# Patient Record
Sex: Female | Born: 1980 | Race: White | Hispanic: No | Marital: Married | Smoking: Never smoker
Health system: Southern US, Community
[De-identification: ages and names within clinical notes are randomized; demographics above are authoritative.]

## PROBLEM LIST (undated history)

## (undated) DIAGNOSIS — Z8719 Personal history of other diseases of the digestive system: Secondary | ICD-10-CM

## (undated) DIAGNOSIS — IMO0002 Reserved for concepts with insufficient information to code with codable children: Secondary | ICD-10-CM

## (undated) DIAGNOSIS — O9229 Other disorders of breast associated with pregnancy and the puerperium: Secondary | ICD-10-CM

## (undated) DIAGNOSIS — O139 Gestational [pregnancy-induced] hypertension without significant proteinuria, unspecified trimester: Secondary | ICD-10-CM

## (undated) DIAGNOSIS — N926 Irregular menstruation, unspecified: Secondary | ICD-10-CM

## (undated) DIAGNOSIS — Z22330 Carrier of Group B streptococcus: Secondary | ICD-10-CM

## (undated) DIAGNOSIS — T4145XA Adverse effect of unspecified anesthetic, initial encounter: Secondary | ICD-10-CM

## (undated) DIAGNOSIS — R300 Dysuria: Secondary | ICD-10-CM

## (undated) DIAGNOSIS — Z8619 Personal history of other infectious and parasitic diseases: Secondary | ICD-10-CM

## (undated) HISTORY — DX: Personal history of other diseases of the digestive system: Z87.19

## (undated) HISTORY — DX: Adverse effect of unspecified anesthetic, initial encounter: T41.45XA

## (undated) HISTORY — DX: Irregular menstruation, unspecified: N92.6

## (undated) HISTORY — PX: NO PAST SURGERIES: SHX2092

## (undated) HISTORY — DX: Reserved for concepts with insufficient information to code with codable children: IMO0002

## (undated) HISTORY — DX: Carrier of group B Streptococcus: Z22.330

## (undated) HISTORY — DX: Dysuria: R30.0

## (undated) HISTORY — DX: Personal history of other infectious and parasitic diseases: Z86.19

## (undated) HISTORY — DX: Gestational (pregnancy-induced) hypertension without significant proteinuria, unspecified trimester: O13.9

## (undated) HISTORY — DX: Other disorders of breast associated with pregnancy and the puerperium: O92.29

## (undated) HISTORY — PX: CYSTOSCOPY: SUR368

---

## 2002-03-29 DIAGNOSIS — IMO0002 Reserved for concepts with insufficient information to code with codable children: Secondary | ICD-10-CM

## 2002-03-29 HISTORY — DX: Reserved for concepts with insufficient information to code with codable children: IMO0002

## 2002-05-28 ENCOUNTER — Other Ambulatory Visit: Admission: RE | Admit: 2002-05-28 | Discharge: 2002-05-28 | Payer: Self-pay | Admitting: *Deleted

## 2003-01-03 ENCOUNTER — Inpatient Hospital Stay (HOSPITAL_COMMUNITY): Admission: AD | Admit: 2003-01-03 | Discharge: 2003-01-03 | Payer: Self-pay | Admitting: Obstetrics and Gynecology

## 2003-01-03 ENCOUNTER — Encounter: Payer: Self-pay | Admitting: Obstetrics and Gynecology

## 2003-03-30 DIAGNOSIS — Z8719 Personal history of other diseases of the digestive system: Secondary | ICD-10-CM

## 2003-03-30 HISTORY — DX: Personal history of other diseases of the digestive system: Z87.19

## 2003-06-14 ENCOUNTER — Inpatient Hospital Stay (HOSPITAL_COMMUNITY): Admission: AD | Admit: 2003-06-14 | Discharge: 2003-06-14 | Payer: Self-pay | Admitting: Obstetrics and Gynecology

## 2003-07-30 ENCOUNTER — Inpatient Hospital Stay (HOSPITAL_COMMUNITY): Admission: AD | Admit: 2003-07-30 | Discharge: 2003-08-01 | Payer: Self-pay | Admitting: Obstetrics and Gynecology

## 2003-09-04 ENCOUNTER — Other Ambulatory Visit: Admission: RE | Admit: 2003-09-04 | Discharge: 2003-09-04 | Payer: Self-pay | Admitting: Obstetrics and Gynecology

## 2005-02-12 ENCOUNTER — Inpatient Hospital Stay (HOSPITAL_COMMUNITY): Admission: AD | Admit: 2005-02-12 | Discharge: 2005-02-15 | Payer: Self-pay | Admitting: Obstetrics and Gynecology

## 2005-03-24 ENCOUNTER — Other Ambulatory Visit: Admission: RE | Admit: 2005-03-24 | Discharge: 2005-03-24 | Payer: Self-pay | Admitting: Obstetrics and Gynecology

## 2005-04-29 DIAGNOSIS — N926 Irregular menstruation, unspecified: Secondary | ICD-10-CM

## 2005-04-29 HISTORY — DX: Irregular menstruation, unspecified: N92.6

## 2006-03-29 DIAGNOSIS — T8859XA Other complications of anesthesia, initial encounter: Secondary | ICD-10-CM

## 2006-03-29 HISTORY — DX: Other complications of anesthesia, initial encounter: T88.59XA

## 2006-10-07 ENCOUNTER — Inpatient Hospital Stay (HOSPITAL_COMMUNITY): Admission: AD | Admit: 2006-10-07 | Discharge: 2006-10-07 | Payer: Self-pay | Admitting: Obstetrics and Gynecology

## 2006-11-01 ENCOUNTER — Inpatient Hospital Stay (HOSPITAL_COMMUNITY): Admission: AD | Admit: 2006-11-01 | Discharge: 2006-11-03 | Payer: Self-pay | Admitting: Obstetrics and Gynecology

## 2006-11-28 DIAGNOSIS — R300 Dysuria: Secondary | ICD-10-CM

## 2006-11-28 HISTORY — DX: Dysuria: R30.0

## 2008-01-20 DIAGNOSIS — O9229 Other disorders of breast associated with pregnancy and the puerperium: Secondary | ICD-10-CM

## 2008-01-20 HISTORY — DX: Other disorders of breast associated with pregnancy and the puerperium: O92.29

## 2009-05-21 ENCOUNTER — Inpatient Hospital Stay (HOSPITAL_COMMUNITY): Admission: RE | Admit: 2009-05-21 | Discharge: 2009-05-23 | Payer: Self-pay | Admitting: Obstetrics and Gynecology

## 2010-06-17 LAB — CBC
HCT: 33.1 % — ABNORMAL LOW (ref 36.0–46.0)
HCT: 34.7 % — ABNORMAL LOW (ref 36.0–46.0)
Hemoglobin: 11.3 g/dL — ABNORMAL LOW (ref 12.0–15.0)
Hemoglobin: 11.8 g/dL — ABNORMAL LOW (ref 12.0–15.0)
MCHC: 33.9 g/dL (ref 30.0–36.0)
MCHC: 34.1 g/dL (ref 30.0–36.0)
MCV: 92.1 fL (ref 78.0–100.0)
MCV: 92.7 fL (ref 78.0–100.0)
Platelets: 149 10*3/uL — ABNORMAL LOW (ref 150–400)
Platelets: 163 10*3/uL (ref 150–400)
RBC: 3.57 MIL/uL — ABNORMAL LOW (ref 3.87–5.11)
RBC: 3.77 MIL/uL — ABNORMAL LOW (ref 3.87–5.11)
RDW: 13.8 % (ref 11.5–15.5)
RDW: 13.9 % (ref 11.5–15.5)
WBC: 10.2 10*3/uL (ref 4.0–10.5)
WBC: 12.9 10*3/uL — ABNORMAL HIGH (ref 4.0–10.5)

## 2010-06-17 LAB — RPR: RPR Ser Ql: NONREACTIVE

## 2010-08-11 NOTE — H&P (Signed)
NAMEMADDELINE, Dickerson NO.:  [id number]   MEDICAL RECORD NO.:  [id number]          PATIENT TYPE:  MAT   LOCATION:  MATC                          [facility]:  [facility]   PHYSICIAN:  Hal Morales, M.D.DATE OF BIRTH:  13-Mar-1981   DATE OF ADMISSION:  11/01/2006  DATE OF DISCHARGE:                              HISTORY & PHYSICAL   HISTORY OF PRESENT ILLNESS:  Ms. Megan Dickerson is a 30 year old gravida 3, para 2-  0-0-2, at 40-3/7 weeks who presented complaining of increased uterine  contractions the last several hours.  Cervix was 2-3 cm in the office  this week.  Pregnancy is remarkable for:  1.  Positive group B Strep; 2.  History of mild pH with first pregnancy.   LABORATORY DATA:  Prenatal labs:  Blood type is O positive, Rh antibody  negative.  VDRL nonreactive.  Rubella titer positive.  Hepatitis B  surface antigen negative.  HIV was declined.  Cystic fibrosis testing  was negative.  GC and Chlamydia cultures were done in February.  The  patient declined first trimester screen.  She declined quadruple screen.  She had a normal Glucola.  She had group B Strep at 36 weeks which was  positive.  Pap smear was done but it is not resulted on our chart.  Hemoglobin upon into practice was 13.3.  It was within normal limits at  26 weeks.   HISTORY OF PRESENT PREGNANCY:  The patient entered care at approximately  six weeks.  She declined first trimester and quad screen.  She had an  ultrasound at eight weeks for dating.  She had another ultrasound at 18  weeks for normal growth and development.  She had a varicose vein in her  leg at 26 weeks and developed some vulvar varicosities at 32 weeks.  She  had positive group B Strep noted at 36 weeks.  She did have some bright  red spotting at 36 weeks and was seen at maternity admissions.  Cervix  was 1-2 cm and 80% vertex and -2 at 37 weeks.  She had an ultrasound on  July 11 for vaginal spotting. There was normal fluid and no  problems  with placenta.   OBSTETRICAL HISTORY:  In 2005 she had a vaginal birth of a female infant,  weight 7 pounds 11 ounces at 40-5/7 weeks.  She was in labor 12 hours.  She had IV anesthesia, analgesia, and local anesthesia.  She did have a  midline episiotomy, and positive group B Strep.  In 2006 she had a  vaginal birth of a female infant, weight 7 pounds 4 ounces at 39-1/7  weeks.  She was in labor 3-1/2 hours.  She had local anesthesia.  She  had premature rupture of membranes without the onset of labor.  In her  first pregnancy she had mild elevated blood pressure in the third  trimester but no preeclampsia.  She also had positive group B Strep with  both previous pregnancies.   PAST MEDICAL HISTORY:  1. She is a previous oral contraceptive user.  2. She reports the  usual childhood illnesses.  3. In childhood she was hospitalized once.  4. She has been hospitalized twice for childbirth.  5. As a child for UTI.   ALLERGIES:  She has no known medication allergies.   FAMILY HISTORY:  Her maternal grandfather passed away from heart  disease.  Maternal grandmother had chronic hypertension.  Maternal  grandmother had varicosities as well as her mother.  Maternal  grandmother had noninsulin dependent diabetes.   GENETIC HISTORY:  Remarkable for the patient's cousin with clubbed foot.   SOCIAL HISTORY:  The patient is married to the father of the baby.  He  is involved and supportive.  His name is Marily Konczal.  The patient is  college educated.  She is a Runner, broadcasting/film/video and a homemaker.  Her husband has 2-  1/2 years of college.  He is employed in Airline pilot.  She has been followed  by the certified nurse midwife service at [facility].  She  denies any alcohol, drug, or tobacco use during this pregnancy.   PHYSICAL EXAMINATION:  VITAL SIGNS:  Stable.  The patient is afebrile.  HEENT:  Within normal limits.  LUNGS:  Breath sounds are clear.  HEART:  Regular rate and rhythm without  murmur.  BREASTS:  Soft and nontender.  ABDOMEN:  Fundal height is approximately 38 cm, estimated fetal weight 7  to 7=1/2 pounds.  Uterine contractions are every 3-5 minutes, mild to  moderate quality.  CERVIX:  Posterior, 4-5 cm, 80%, vertex, and a -1 station.  Bulging bag  of water.  Fetal heart rate is reassuring with negative spontaneous CSG.  EXTREMITIES:  Reflexes are 2+ without clonus.  There is a trace edema  noted.   IMPRESSION:  1. 40-3/7 weeks.  2. Early labor.  3. Positive group B Strep.   PLAN:  1. Admit to birthing suite per consult with Dr. Pennie Rushing as attending      physician.  2. Routine certified nurse midwife orders.  3. Plan group B Strep prophylaxis with ampicillin secondary to      possible speed of advancing labor.  4. Will give antibiotic first dose, then consider artificial rupture      of membranes p.r.n. for labor progress.      Renaldo Reel Megan Dickerson, C.N.M.      Hal Morales, M.D.  Electronically Signed    VLL/MEDQ  D:  11/01/2006  T:  11/01/2006  Job:  [id number]

## 2010-08-14 NOTE — H&P (Signed)
NAMEKAMALI, SAKATA                           ACCOUNT NO.:  [id number]   MEDICAL RECORD NO.:  [id number]                   PATIENT TYPE:  INP   LOCATION:  9169                                 [facility]ACILITY:  [facility]   PHYSICIAN:  Crist Fat. Rivard, M.D.              DATE OF BIRTH:  01-07-1981   DATE OF ADMISSION:  07/30/2003  DATE OF DISCHARGE:                                HISTORY & PHYSICAL   Ms. Riccardi is a 30 year old gravida 1, para 0, at 40-5/7 weeks, who presented  with uterine contractions every three to five minutes times several hours.  She denied any leaking or bleeding and reported positive fetal movement.  Cervix had been 3 cm in the office.  Prenatal course had been remarkable  for:   1. Positive group B strep.  2. First trimester bleeding.  3. Mild elevation of blood pressure over the last one to two weeks with no     evidence of preeclampsia.    PRENATAL LABORATORY DATA:  Blood type is O positive, Rh antibody negative.  VDRL nonreactive.  Rubella titer positive.  Hepatitis B surface antigen  negative.  HIV was declined.  GC and Chlamydia cultures were declined.  Cystic fibrosis testing was declined.  Hemoglobin upon entering the practice  was 12.6 and was 11.5 at 27 weeks.  Group B strep culture was positive at 36  weeks.  GC and Chlamydia cultures were negative.  EDC of July 22, 2003, was  established by last menstrual period and was in agreement with ultrasound at  approximately 18 weeks.  One-hour Glucola was normal.   HISTORY OF PRESENT PREGNANCY:  The patient entered care at approximately 10  weeks.  She declined cultures.  She had some brown vaginal discharge at 11  weeks.  This was evaluated by ultrasound.  She had another ultrasound at 18  weeks and had limited anatomy.  She was treated for UTI at 20 weeks with a  yeast infection.  She has had a normal Glucola.  She was exposed to fifth  disease at 31 weeks.  She had negative titers.  She had some decreased fetal  movement and contractions at 34 weeks, which was treated with terbutaline  subcu, and also had negative cultures.  The rest of her pregnancy was  essentially uncomplicated.   PAST OBSTETRICAL HISTORY:  The patient is a primigravida.   PAST MEDICAL HISTORY:  She was on oral contraceptives until July 2004.  She  reports the usual childhood illnesses.  She has occasional yeast infections.  She had frequent cystitis as a child.   She has no known medication allergies.   FAMILY HISTORY:  Her maternal grandfather is deceased.  Her paternal  grandmother has hypertension.  Her mother had varicosities.  Her maternal  grandmother had noninsulin-dependent diabetes.  Father had migraines.  Paternal grandmother had possible breast cancer.  Paternal uncle, paternal  aunts, and  maternal grandfather were all smokers.   GENETIC HISTORY:  Remarkable for the patient's cousin having club feet.   SOCIAL HISTORY:  The patient is married to the father of the baby.  He is  involved and supportive.  His name is Kennie Karapetian.  The patient is Caucasian,  of the [address] faith.  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  Her husband has 2-1/2 years of college.  He is employed in Airline pilot.  She has been followed by the certified nurse midwife service of [facility].  She denies any alcohol, drug, or tobacco use during this  pregnancy.   PHYSICAL EXAMINATION:  VITAL SIGNS:  Blood pressure is 130/89 and 130/82,  other vital signs are stable.  HEENT:  Within normal limits.  CHEST:  Bilateral breath sounds are clear.  CARDIAC:  Regular rate and rhythm without murmur.  BREASTS:  Soft and nontender.  ABDOMEN:  Fundal height is approximately 39 cm.  Estimated fetal weight 7-  1/2 to 8 pounds.  Uterine contractions are every three minutes, 60 seconds  in duration, moderate quality.  PELVIC:  Cervical exam 4-5 cm, 90%, vertex at a -1 station with probable  transverse position of the vertex.   Fetal monitor is reactive with no  deceleration.  EXTREMITIES:  Deep tendon reflexes are 2+ without clonus.  There is a trace  edema noted.   IMPRESSION:  1. Intrauterine pregnancy at 40-5/7 weeks.  2. Early active labor.  3. Positive group B strep.   PLAN:  1. Admit to birthing suite per consult with Dr. Estanislado Pandy as attending     physician.  2. Routine certified nurse midwife orders.  3. Plan group B strep prophylaxis with penicillin G per standard dosing.  4. Patient declined pain medication at present.     Renaldo Reel Emilee Hero, C.N.M.                   Crist Fat Rivard, M.D.    Leeanne Mannan  D:  07/30/2003  T:  07/30/2003  Job:  [id number]

## 2010-08-14 NOTE — H&P (Signed)
[name], Megan Dickerson NO.:  [id number]   MEDICAL RECORD NO.:  [id number]          PATIENT TYPE:  INP   LOCATION:  9169                          [facility]   PHYSICIAN:  Hal Morales, M.D.DATE OF BIRTH:  Aug 03, 1980   DATE OF ADMISSION:  02/12/2005  DATE OF DISCHARGE:                                HISTORY & PHYSICAL   Megan Dickerson is a 30 year old married white female gravida 2, para 1-0-0-1 who  presents to the office at 39-1/[redacted] weeks gestation with questionable leaking  of fluid.  She began leaking clear fluid at 7 a.m.  She denies bleeding.  Reports mild contractions.  Reports positive fetal movements and denies  signs and symptoms of PIH.  Her pregnancy has been followed by the [facility] certified nurse midwife service and has been remarkable for  mild PIH with her first pregnancy, closely spaced pregnancies, group B Strep  positive.  Her prenatal laboratories were collected on July 14, 2004.  Hemoglobin 12.5, hematocrit 35.6, platelets 241,000.  Blood type O+.  Antibody negative.  RPR nonreactive.  Rubella immune.  Hepatitis B surface  antigen negative.  Pap smear within normal limits from June of 2005.  Gonorrhea negative.  Chlamydia negative.  Cystic fibrosis negative.  Her one-  hour Glucola was collected on November 16, 2004 and was 71.  RPR at that time  was nonreactive.  Hemoglobin at that time was 12.6.  Culture of the vaginal  tract for group B Strep, gonorrhea, and Chlamydia from January 05, 2005  gonorrhea and Chlamydia were negative, group B Strep was positive.   HISTORY OF PRESENT PREGNANCY:  Patient presented for [facility] to [facility] on July 23, 2004 at [redacted] weeks gestation.  She declined a quad  screen.  Pregnancy ultrasonography at 17-6/7 weeks shows growth consistent  with previous dating confirming [facility] of February 18, 2005.  Patient had an  episode of bright red bleeding at 33-5/[redacted] weeks gestation that appeared to be  due  to two blood blisters on her labia majora.  Rest of her prenatal care  was unremarkable.   OB HISTORY:  She is a gravida 2, para 1-0-0-1.  In May of 2005 she had a  vaginal delivery of a female infant weighing 7 pounds 11 ounces at 40-5/[redacted]  weeks gestation after 12 hours of labor.  She had IV pain medication.  Infant's name was Paediatric nurse.  She had a midline episiotomy with that delivery.  She had mild elevated blood pressure in the third trimester, but no  preeclampsia.  She also had group B Strep with that pregnancy.   MEDICAL HISTORY:  She has no medication allergies.  She experienced menarche  at the age of 21 with 28-day cycles lasting four days.  She reports having  had the usual childhood illnesses.  She has used oral contraceptives in the  past.  She used to have frequent cystitis as a child.   SURGICAL HISTORY:  Negative.   FAMILY HISTORY:  Remarkable for maternal grandfather with heart disease,  paternal grandmother with hypertension, maternal  grandmother with  varicosities as well as diabetes, paternal grandmother with breast cancer.   GENETIC HISTORY:  Remarkable for patient's cousin with club foot.   SOCIAL HISTORY:  Patient is married to the father of the baby.  His name is  Vincenza Hews.  He is involved and supportive.  They are of the [facility] faith.  Patient has 16 years education and is educated as a Runner, broadcasting/film/video, but unemployed  currently.  Father of the baby has 14-1/2 years education and is employed  full-time in Airline pilot.  They deny any alcohol, tobacco, or illicit drug use in  the pregnancy.   OBJECTIVE:  VITAL SIGNS:  Stable.  She is afebrile.  HEENT:  Grossly within normal limits.  CHEST:  Clear to auscultation.  HEART:  Regular rate and rhythm.  ABDOMEN:  Gravid in contour with fundal height extending approximately 38 cm  above the pubic symphysis.  Fetal heart rate is dopplered in the 140s.  Mild  uterine contractions per patient.  PELVIC:  Cervix is 3.5 cm, 80%, vertex, -2  with copious clear fluid present  that is positive for Nitrazine.  EXTREMITIES:  Within normal limits.   ASSESSMENT:  1.  Intrauterine pregnancy at term.  2.  Spontaneous rupture of membranes.  3.  Group B Strep positive.   PLAN:  1.  Admit to birthing suites and Dr. Pennie Rushing has been notified.  2.  Routine C.N.M. orders.  3.  Plan penicillin G for group B Strep prophylaxis.  4.  Anticipate spontaneous vaginal delivery.      Megan Dickerson, C.N.M.      Hal Morales, M.D.  Electronically Signed    KS/MEDQ  D:  02/12/2005  T:  02/12/2005  Job:  [id number]

## 2010-12-04 LAB — HIV ANTIBODY (ROUTINE TESTING W REFLEX): HIV: NONREACTIVE

## 2010-12-04 LAB — ANTIBODY SCREEN: Antibody Screen: NEGATIVE

## 2010-12-04 LAB — ABO/RH: RH Type: POSITIVE

## 2011-01-11 LAB — RAPID HIV SCREEN (WH-MAU): Rapid HIV Screen: NONREACTIVE

## 2011-01-11 LAB — CBC
HCT: 32.4 — ABNORMAL LOW
Hemoglobin: 11.9 — ABNORMAL LOW
Platelets: 193
Platelets: 236
RDW: 12.8
RDW: 12.9
WBC: 11.9 — ABNORMAL HIGH

## 2011-01-11 LAB — RPR: RPR Ser Ql: NONREACTIVE

## 2011-03-30 NOTE — L&D Delivery Note (Signed)
Delivery Note At 6:21 PM a viable female, "Megan Dickerson", was delivered via Vaginal, Spontaneous Delivery (Presentation: Left Occiput Anterior).  APGAR: 9, 10; weight 7 lb 9.3 oz (3440 g).   Placenta status: Intact, Spontaneous.  Cord: 3 vessels with the following complications: None.  Cord pH: NA  Anesthesia:   Episiotomy: None Lacerations: None Suture Repair: None Est. Blood Loss (mL): 175  Mom to postpartum.  Baby to skin to skin. Circumcision planned. Consent signed in L&D.  Nigel Bridgeman 06/29/2011, 6:42 PM

## 2011-06-07 ENCOUNTER — Encounter (INDEPENDENT_AMBULATORY_CARE_PROVIDER_SITE_OTHER): Payer: BC Managed Care – PPO | Admitting: Registered Nurse

## 2011-06-07 DIAGNOSIS — Z331 Pregnant state, incidental: Secondary | ICD-10-CM

## 2011-06-14 ENCOUNTER — Encounter (INDEPENDENT_AMBULATORY_CARE_PROVIDER_SITE_OTHER): Payer: BC Managed Care – PPO | Admitting: Obstetrics and Gynecology

## 2011-06-14 DIAGNOSIS — Z331 Pregnant state, incidental: Secondary | ICD-10-CM

## 2011-06-21 ENCOUNTER — Encounter (INDEPENDENT_AMBULATORY_CARE_PROVIDER_SITE_OTHER): Payer: BC Managed Care – PPO | Admitting: Obstetrics and Gynecology

## 2011-06-21 DIAGNOSIS — Z331 Pregnant state, incidental: Secondary | ICD-10-CM

## 2011-06-28 ENCOUNTER — Encounter (INDEPENDENT_AMBULATORY_CARE_PROVIDER_SITE_OTHER): Payer: BC Managed Care – PPO | Admitting: Obstetrics and Gynecology

## 2011-06-28 DIAGNOSIS — Z331 Pregnant state, incidental: Secondary | ICD-10-CM

## 2011-06-29 ENCOUNTER — Inpatient Hospital Stay (HOSPITAL_COMMUNITY)
Admission: AD | Admit: 2011-06-29 | Discharge: 2011-07-01 | DRG: 373 | Disposition: A | Discharge status: Home / Self Care | Payer: BC Managed Care – PPO | Source: Ambulatory Visit | Attending: Obstetrics and Gynecology | Admitting: Obstetrics and Gynecology

## 2011-06-29 ENCOUNTER — Encounter (HOSPITAL_COMMUNITY): Payer: Self-pay | Admitting: *Deleted

## 2011-06-29 DIAGNOSIS — O99892 Other specified diseases and conditions complicating childbirth: Principal | ICD-10-CM | POA: Diagnosis present

## 2011-06-29 DIAGNOSIS — Z3490 Encounter for supervision of normal pregnancy, unspecified, unspecified trimester: Secondary | ICD-10-CM

## 2011-06-29 DIAGNOSIS — O99820 Streptococcus B carrier state complicating pregnancy: Secondary | ICD-10-CM

## 2011-06-29 DIAGNOSIS — Z22330 Carrier of Group B streptococcus: Secondary | ICD-10-CM

## 2011-06-29 HISTORY — DX: Reserved for concepts with insufficient information to code with codable children: IMO0002

## 2011-06-29 LAB — CBC
HCT: 36.2 % (ref 36.0–46.0)
Hemoglobin: 12.3 g/dL (ref 12.0–15.0)
MCV: 90.7 fL (ref 78.0–100.0)
RBC: 3.99 MIL/uL (ref 3.87–5.11)
RDW: 13.4 % (ref 11.5–15.5)
WBC: 9.4 10*3/uL (ref 4.0–10.5)

## 2011-06-29 LAB — GC/CHLAMYDIA PROBE AMP, GENITAL

## 2011-06-29 LAB — RPR: RPR: NONREACTIVE

## 2011-06-29 MED ORDER — TETANUS-DIPHTH-ACELL PERTUSSIS 5-2.5-18.5 LF-MCG/0.5 IM SUSP: 0.5000 mL | Freq: Once | INTRAMUSCULAR | Status: DC

## 2011-06-29 MED ORDER — ZOLPIDEM TARTRATE 5 MG PO TABS: 5.0000 mg | ORAL_TABLET | Freq: Every evening | ORAL | Status: DC | PRN

## 2011-06-29 MED ORDER — PENICILLIN G POTASSIUM 5000000 UNITS IJ SOLR
2.5000 10*6.[IU] | INTRAVENOUS | Status: DC
  Administered 2011-06-29 (×2): 2.5 10*6.[IU] via INTRAVENOUS
  Filled 2011-06-29 (×5): qty 2.5

## 2011-06-29 MED ORDER — OXYTOCIN 20 UNITS IN LACTATED RINGERS INFUSION - SIMPLE
1.0000 m[IU]/min | INTRAVENOUS | Status: DC
  Administered 2011-06-29: 2 m[IU]/min via INTRAVENOUS
  Filled 2011-06-29: qty 1000

## 2011-06-29 MED ORDER — ONDANSETRON HCL 4 MG/2ML IJ SOLN: 4.0000 mg | INTRAMUSCULAR | Status: DC | PRN

## 2011-06-29 MED ORDER — LACTATED RINGERS IV SOLN
INTRAVENOUS | Status: DC
  Administered 2011-06-29 (×2): via INTRAVENOUS

## 2011-06-29 MED ORDER — PENICILLIN G POTASSIUM 5000000 UNITS IJ SOLR
5.0000 10*6.[IU] | Freq: Once | INTRAVENOUS | Status: AC
  Administered 2011-06-29: 5 10*6.[IU] via INTRAVENOUS
  Filled 2011-06-29: qty 5

## 2011-06-29 MED ORDER — WITCH HAZEL-GLYCERIN EX PADS: 1.0000 "application " | MEDICATED_PAD | CUTANEOUS | Status: DC | PRN

## 2011-06-29 MED ORDER — SENNOSIDES-DOCUSATE SODIUM 8.6-50 MG PO TABS
2.0000 | ORAL_TABLET | Freq: Every day | ORAL | Status: DC
  Administered 2011-06-29 – 2011-06-30 (×2): 2 via ORAL

## 2011-06-29 MED ORDER — IBUPROFEN 600 MG PO TABS
600.0000 mg | ORAL_TABLET | Freq: Four times a day (QID) | ORAL | Status: DC
  Administered 2011-06-29 – 2011-07-01 (×5): 600 mg via ORAL
  Filled 2011-06-29 (×7): qty 1

## 2011-06-29 MED ORDER — LACTATED RINGERS IV SOLN: 500.0000 mL | INTRAVENOUS | Status: DC | PRN

## 2011-06-29 MED ORDER — ONDANSETRON HCL 4 MG/2ML IJ SOLN
4.0000 mg | INTRAMUSCULAR | Status: AC
  Administered 2011-06-29: 4 mg via INTRAVENOUS

## 2011-06-29 MED ORDER — BENZOCAINE-MENTHOL 20-0.5 % EX AERO
INHALATION_SPRAY | CUTANEOUS | Status: AC
  Administered 2011-06-29: 23:00:00
  Filled 2011-06-29: qty 56

## 2011-06-29 MED ORDER — OXYTOCIN 20 UNITS IN LACTATED RINGERS INFUSION - SIMPLE: 125.0000 mL/h | Freq: Once | INTRAVENOUS | Status: DC

## 2011-06-29 MED ORDER — DIPHENHYDRAMINE HCL 25 MG PO CAPS: 25.0000 mg | ORAL_CAPSULE | Freq: Four times a day (QID) | ORAL | Status: DC | PRN

## 2011-06-29 MED ORDER — SIMETHICONE 80 MG PO CHEW: 80.0000 mg | CHEWABLE_TABLET | ORAL | Status: DC | PRN

## 2011-06-29 MED ORDER — LANOLIN HYDROUS EX OINT: TOPICAL_OINTMENT | CUTANEOUS | Status: DC | PRN

## 2011-06-29 MED ORDER — FLEET ENEMA 7-19 GM/118ML RE ENEM: 1.0000 | ENEMA | RECTAL | Status: DC | PRN

## 2011-06-29 MED ORDER — TERBUTALINE SULFATE 1 MG/ML IJ SOLN: 0.2500 mg | Freq: Once | INTRAMUSCULAR | Status: DC | PRN

## 2011-06-29 MED ORDER — ONDANSETRON HCL 4 MG PO TABS: 4.0000 mg | ORAL_TABLET | ORAL | Status: DC | PRN

## 2011-06-29 MED ORDER — BENZOCAINE-MENTHOL 20-0.5 % EX AERO: 1.0000 "application " | INHALATION_SPRAY | CUTANEOUS | Status: DC | PRN

## 2011-06-29 MED ORDER — OXYCODONE-ACETAMINOPHEN 5-325 MG PO TABS
1.0000 | ORAL_TABLET | ORAL | Status: DC | PRN
  Administered 2011-06-30: 1 via ORAL
  Filled 2011-06-29 (×2): qty 1

## 2011-06-29 MED ORDER — DIBUCAINE 1 % RE OINT: 1.0000 "application " | TOPICAL_OINTMENT | RECTAL | Status: DC | PRN

## 2011-06-29 MED ORDER — IBUPROFEN 600 MG PO TABS: 600.0000 mg | ORAL_TABLET | Freq: Four times a day (QID) | ORAL | Status: DC | PRN

## 2011-06-29 MED ORDER — BUTORPHANOL TARTRATE 2 MG/ML IJ SOLN
1.0000 mg | INTRAMUSCULAR | Status: DC | PRN
  Administered 2011-06-29 (×2): 1 mg via INTRAVENOUS
  Filled 2011-06-29 (×2): qty 1

## 2011-06-29 MED ORDER — OXYCODONE-ACETAMINOPHEN 5-325 MG PO TABS: 1.0000 | ORAL_TABLET | ORAL | Status: DC | PRN

## 2011-06-29 MED ORDER — ONDANSETRON HCL 4 MG/2ML IJ SOLN
4.0000 mg | Freq: Four times a day (QID) | INTRAMUSCULAR | Status: DC | PRN
  Administered 2011-06-29: 4 mg via INTRAVENOUS
  Filled 2011-06-29 (×2): qty 2

## 2011-06-29 MED ORDER — ZOLPIDEM TARTRATE 10 MG PO TABS: 10.0000 mg | ORAL_TABLET | Freq: Every evening | ORAL | Status: DC | PRN

## 2011-06-29 MED ORDER — CITRIC ACID-SODIUM CITRATE 334-500 MG/5ML PO SOLN: 30.0000 mL | ORAL | Status: DC | PRN

## 2011-06-29 MED ORDER — LIDOCAINE HCL (PF) 1 % IJ SOLN: 30.0000 mL | INTRAMUSCULAR | Status: DC | PRN

## 2011-06-29 MED ORDER — PRENATAL MULTIVITAMIN CH
1.0000 | ORAL_TABLET | Freq: Every day | ORAL | Status: DC
  Administered 2011-06-30 – 2011-07-01 (×2): 1 via ORAL
  Filled 2011-06-29 (×2): qty 1

## 2011-06-29 MED ORDER — OXYTOCIN BOLUS FROM INFUSION
500.0000 mL | Freq: Once | INTRAVENOUS | Status: DC
  Filled 2011-06-29: qty 500

## 2011-06-29 MED ORDER — ACETAMINOPHEN 325 MG PO TABS: 650.0000 mg | ORAL_TABLET | ORAL | Status: DC | PRN

## 2011-06-29 NOTE — MAU Note (Signed)
PT SAYS HAS BEEN  HAVING UC ALL NIGHT  BUT ARE LESS NOW..   VE-  YESTERDAY 3-4 CM  DENIES HSV AND MRSA

## 2011-06-29 NOTE — H&P (Signed)
Megan Dickerson is a 31 y.o. female, G5P4004 at 86 weeks presenting for contractions during the night--cervix was 3-4 cm yesterday.  Denies leaking or bleeding, reports +FM.  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  History  History of present pregnancy: Patient entered care at 10 weeks.  EDC of was established by 10 week [place].  Anatomy scan was done at 18 weeks, with normal findings.  Genetic testing was declined. Pap on 01/14/11 showed HR HPV with ASCUS.  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  Per consult with Dr. Estanislado Pandy, plan Pap at pp visit.   Her prenatal course was essentially uncomplicated.  Further signficant events were positive GBS. At her last evalution, she was 3-4 cm. . OB History    Grav Para Term Preterm Abortions TAB SAB Ect Mult Living   5 4 4       4     #1--2005. SVB female, 7+11 at 40 5/7 weeks. 12 hour labor.  IV med.  Had elevated BP in 3rd trimester, no meds #2--2006. SVB female, 7+4 at 20 5/7 weeks. 8 hour labor. IV meds. #3--2008. SVB female. 8+7 at 40 weeks. 4-5 hour labor.  IV meds. #4--2011. SVB female. 8+7 at 39 weeks. 6 hour labor.  IV meds. GBS positive in all pregnancies  Past Medical History  Diagnosis Date  . Abnormal Pap smear   GERD.  Previous user of OCPs, patch, mini-pills.  Usual childhood illnesses  Past Surgical History  Procedure Date  . No past surgeries   Cystoscopy age 29  Family History: family history is not on file. MGM CHF.  Father HTN, thyroid disease.  PGM breast ca.  FH etoh and cig use.    Social History:  reports that she has never smoked. She does not have any smokeless tobacco history on file. She reports that she does not drink alcohol or use illicit drugs. FOB is involved and supportive, Shane.  Patient is Caucasian, SAHM, college educated, of the [employer].  FOB has some college, is a Psychologist, sport and exercise.     ROS:   Contractions, +FM, denies leaking or bleeding.  Dilation: 5 Effacement (%): 70 Station: -2 Exam by:: V.Wei Poplaski,CNM Blood pressure 116/78, pulse 85, temperature 98.8 F (37.1 C), temperature source Oral, resp. rate 20, height 5\' 3"  (1.6 m), weight 182 lb 2 oz (82.611 kg).  Exam Physical Exam   Chest clear Heart RRR without murmur Abd gravid, NT Pelvic as above, cervix posterior. Ext WNL  FHR reactive, no decels UC q 5 minutes, mild.  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 Normand Sloop Routine CNM orders Plan GBS prophylaxis with PCN G Observe progress, augment with pitocin or AROM as needed. Patient prefers IV pain medication, if needed.   Nigel Bridgeman 06/29/2011, 7:19 AM

## 2011-06-29 NOTE — Progress Notes (Signed)
  Subjective: Not any more uncomfortable s/p AROM--frequent mild contractions, but not painful.  Objective: BP 102/69  Pulse 83  Temp(Src) 97.6 F (36.4 C) (Oral)  Resp 18  Ht 5\' 3"  (1.6 m)  Wt 182 lb 2 oz (82.611 kg)  BMI 32.26 kg/m2      FHT:  Category 1 UC:   q2-3 min, very mild.  Assessment / Plan: No advancement of labor after AROM, advanced dilation Will start low-dose pitocin and observe.  Danaysia Rader 06/29/2011, 1:11 PM

## 2011-06-29 NOTE — Progress Notes (Signed)
  Subjective: Nausea has resolved, some contractions slightly stronger than others.  Objective: BP 110/62  Pulse 73  Temp(Src) 98 F (36.7 C) (Oral)  Resp 18  Ht 5\' 3"  (1.6 m)  Wt 182 lb 2 oz (82.611 kg)  BMI 32.26 kg/m2      FHT:  Category 1 UC:   q 2-4 min, moderate to palpation Pitocin on 8 mu/min  Assessment / Plan: Improved contraction pattern Will continue to observe.  Nigel Bridgeman 06/29/2011, 4:02 PM

## 2011-06-29 NOTE — Progress Notes (Signed)
  Subjective: Doing well, not uncomfortable.  Received 1st dose PCN--next dose due at 12.  Objective: BP 102/69  Pulse 83  Temp(Src) 98.8 F (37.1 C) (Oral)  Resp 18  Ht 5\' 3"  (1.6 m)  Wt 182 lb 2 oz (82.611 kg)  BMI 32.26 kg/m2      FHT: Category 1 UC:   Sporadic now, mild/mod SVE:   Dilation: 6.5 Effacement (%): 80 Station: -1 Exam by:: Nigel Bridgeman, cnm AROM--clear fluid  Labs: Lab Results  Component Value Date   WBC 9.4 06/29/2011   HGB 12.3 06/29/2011   HCT 36.2 06/29/2011   MCV 90.7 06/29/2011   PLT 152 06/29/2011    Assessment / Plan: Spontaneous labor, progressing normally Will continue to observe s/p AROM--will augment if no progress in next 2 hours.  Nigel Bridgeman 06/29/2011, 11:17 AM

## 2011-06-29 NOTE — Progress Notes (Signed)
  Subjective: Feeling more pressure, standing at bedside.  Received Stadol and Zofran at 5:21pm.  Objective: BP 115/80  Pulse 72  Temp(Src) 98 F (36.7 C) (Oral)  Resp 18  Ht 5\' 3"  (1.6 m)  Wt 182 lb 2 oz (82.611 kg)  BMI 32.26 kg/m2      FHT:  Category 1 UC:   regular, every 2 minutes SVE:   Dilation: 10 Effacement (%): 100 Station: -1 Exam by:: Nigel Bridgeman, CnM Pitocin on 10 mu/min   Assessment / Plan: Will await increased pressure/urge to push.  Nigel Bridgeman 06/29/2011, 5:49 PM

## 2011-06-29 NOTE — Progress Notes (Signed)
  Subjective: Considering pain medication, due to back pain with contractions.    Objective: BP 103/71  Pulse 79  Temp(Src) 98 F (36.7 C) (Oral)  Resp 18  Ht 5\' 3"  (1.6 m)  Wt 182 lb 2 oz (82.611 kg)  BMI 32.26 kg/m2      FHT:  Category 1 UC:   irregular, every 2-4 minutes, mild SVE:   Dilation: 8 Effacement (%): 80 Station: -1 Exam by:: Nigel Bridgeman, cnm, Minimal change from last exam, cervix very stretchy. Pitocin on 4 mu/min   Assessment / Plan: Irregular contractions. Continue augmentation Stadol and Zofran. Will continue to observe   Ayvin Lipinski 06/29/2011, 3:07 PM

## 2011-06-29 NOTE — Progress Notes (Signed)
  Subjective: Feeling more nausea, contractions more painful.  Objective: BP 115/80  Pulse 72  Temp(Src) 98 F (36.7 C) (Oral)  Resp 18  Ht 5\' 3"  (1.6 m)  Wt 182 lb 2 oz (82.611 kg)  BMI 32.26 kg/m2      FHT:  Category 1, some early decels UC:   regular, every 2 minutes SVE:   Dilation: 8 Effacement (%): 90 Station: -1 Exam by:: Nigel Bridgeman, CNM Cervix very soft, stretchy, thin.   Assessment / Plan: Will repeat Stadol and Zofran now. Anticipate vaginal delivery.  Nigel Bridgeman 06/29/2011, 5:13 PM

## 2011-06-30 ENCOUNTER — Encounter (HOSPITAL_COMMUNITY): Payer: Self-pay | Admitting: *Deleted

## 2011-06-30 LAB — CBC
HCT: 32.3 % — ABNORMAL LOW (ref 36.0–46.0)
Hemoglobin: 10.7 g/dL — ABNORMAL LOW (ref 12.0–15.0)
MCH: 30.4 pg (ref 26.0–34.0)
MCHC: 33.1 g/dL (ref 30.0–36.0)
MCV: 91.8 fL (ref 78.0–100.0)
Platelets: 150 10*3/uL (ref 150–400)
RDW: 13.6 % (ref 11.5–15.5)
WBC: 13.1 10*3/uL — ABNORMAL HIGH (ref 4.0–10.5)

## 2011-06-30 MED ORDER — LIDOCAINE 1%/NA BICARB 0.1 MEQ INJECTION: 0.8000 mL | INJECTION | Freq: Once | INTRAVENOUS | Status: DC

## 2011-06-30 MED ORDER — PRENATAL MULTIVITAMIN CH: 1.0000 | ORAL_TABLET | Freq: Every day | ORAL | Status: DC

## 2011-06-30 MED ORDER — ACETAMINOPHEN FOR CIRCUMCISION 160 MG/5 ML: 40.0000 mg | ORAL | Status: DC | PRN

## 2011-06-30 MED ORDER — FAMOTIDINE 20 MG PO TABS
20.0000 mg | ORAL_TABLET | Freq: Two times a day (BID) | ORAL | Status: DC
  Administered 2011-06-30 – 2011-07-01 (×2): 20 mg via ORAL
  Filled 2011-06-30 (×2): qty 1

## 2011-06-30 MED ORDER — SUCROSE 24% NICU/PEDS ORAL SOLUTION
0.5000 mL | OROMUCOSAL | Status: DC
  Filled 2011-06-30 (×2): qty 0.5

## 2011-06-30 MED ORDER — EPINEPHRINE TOPICAL FOR CIRCUMCISION 0.1 MG/ML: 1.0000 [drp] | TOPICAL | Status: DC | PRN

## 2011-06-30 MED ORDER — ACETAMINOPHEN FOR CIRCUMCISION 160 MG/5 ML: 40.0000 mg | Freq: Once | ORAL | Status: DC

## 2011-06-30 NOTE — Progress Notes (Addendum)
Post Partum Day 1 Subjective: no complaints, up ad lib, voiding, tolerating PO and plans breastfeeding.  Objective: Blood pressure 99/64, pulse 80, temperature 98.4 F (36.9 C), temperature source Oral, resp. rate 16, height 5\' 3"  (1.6 m), weight 182 lb 2 oz (82.611 kg), unknown if currently breastfeeding.  Physical Exam:  General: alert and no distress Lochia: appropriate Uterine Fundus: firm, NT Incision: n/a DVT Evaluation: No evidence of DVT seen on physical exam. No cords or calf tenderness. No significant calf/ankle edema.   Basename 06/30/11 0538 06/29/11 0730  HGB 10.7* 12.3  HCT 32.3* 36.2    Assessment/Plan: Plan for discharge tomorrow, Breastfeeding and Circumcision prior to discharge (done 06/29/11).   LOS: 1 day   Katilyn Miltenberger Y 06/30/2011, 6:24 PM

## 2011-07-01 MED ORDER — IBUPROFEN 600 MG PO TABS: 600.0000 mg | ORAL_TABLET | Freq: Four times a day (QID) | ORAL | Status: AC | PRN

## 2011-07-01 MED ORDER — NORETHINDRONE 0.35 MG PO TABS: 1.0000 | ORAL_TABLET | Freq: Every day | ORAL | Status: DC

## 2011-07-01 MED ORDER — OXYCODONE-ACETAMINOPHEN 5-325 MG PO TABS
1.0000 | ORAL_TABLET | ORAL | Status: AC | PRN
Start: 2011-07-01 — End: 2011-07-11

## 2011-07-01 NOTE — Discharge Instructions (Signed)
Breastfeeding BENEFITS OF BREASTFEEDING For the baby  The first milk (colostrum) helps the baby's digestive system function better.   There are antibodies from the mother in the milk that help the baby fight off infections.   The baby has a lower incidence of asthma, allergies, and SIDS (sudden infant death syndrome).   The nutrients in breast milk are better than formulas for the baby and helps the baby's brain grow better.   Babies who breastfeed have less gas, colic, and constipation.  For the mother  Breastfeeding helps develop a very special bond between mother and baby.   It is more convenient, always available at the correct temperature and cheaper than formula feeding.   It burns calories in the mother and helps with losing weight that was gained during pregnancy.   It makes the uterus contract back down to normal size faster and slows bleeding following delivery.   Breastfeeding mothers have a lower risk of developing breast cancer.  NURSE FREQUENTLY  A healthy, full-term baby may breastfeed as often as every hour or space his or her feedings to every 3 hours.   How often to nurse will vary from baby to baby. Watch your baby for signs of hunger, not the clock.   Nurse as often as the baby requests, or when you feel the need to reduce the fullness of your breasts.   Awaken the baby if it has been 3 to 4 hours since the last feeding.   Frequent feeding will help the mother make more milk and will prevent problems like sore nipples and engorgement of the breasts.  BABY'S POSITION AT THE BREAST  Whether lying down or sitting, be sure that the baby's tummy is facing your tummy.   Support the breast with 4 fingers underneath the breast and the thumb above. Make sure your fingers are well away from the nipple and baby's mouth.   Stroke the baby's lips and cheek closest to the breast gently with your finger or nipple.   When the baby's mouth is open wide enough, place all  of your nipple and as much of the dark area around the nipple as possible into your baby's mouth.   Pull the baby in close so the tip of the nose and the baby's cheeks touch the breast during the feeding.  FEEDINGS  The length of each feeding varies from baby to baby and from feeding to feeding.   The baby must suck about 2 to 3 minutes for your milk to get to him or her. This is called a "let down." For this reason, allow the baby to feed on each breast as long as he or she wants. Your baby will end the feeding when he or she has received the right balance of nutrients.   To break the suction, put your finger into the corner of the baby's mouth and slide it between his or her gums before removing your breast from his or her mouth. This will help prevent sore nipples.  REDUCING BREAST ENGORGEMENT  In the first week after your baby is born, you may experience signs of breast engorgement. When breasts are engorged, they feel heavy, warm, full, and may be tender to the touch. You can reduce engorgement if you:   Nurse frequently, every 2 to 3 hours. Mothers who breastfeed early and often have fewer problems with engorgement.   Place light ice packs on your breasts between feedings. This reduces swelling. Wrap the ice packs in a   lightweight towel to protect your skin.   Apply moist hot packs to your breast for 5 to 10 minutes before each feeding. This increases circulation and helps the milk flow.   Gently massage your breast before and during the feeding.   Make sure that the baby empties at least one breast at every feeding before switching sides.   Use a breast pump to empty the breasts if your baby is sleepy or not nursing well. You may also want to pump if you are returning to work or or you feel you are getting engorged.   Avoid bottle feeds, pacifiers or supplemental feedings of water or juice in place of breastfeeding.   Be sure the baby is latched on and positioned properly while  breastfeeding.   Prevent fatigue, stress, and anemia.   Wear a supportive bra, avoiding underwire styles.   Eat a balanced diet with enough fluids.  If you follow these suggestions, your engorgement should improve in 24 to 48 hours. If you are still experiencing difficulty, call your lactation consultant or caregiver. IS MY BABY GETTING ENOUGH MILK? Sometimes, mothers worry about whether their babies are getting enough milk. You can be assured that your baby is getting enough milk if:  The baby is actively sucking and you hear swallowing.   The baby nurses at least 8 to 12 times in a 24 hour time period. Nurse your baby until he or she unlatches or falls asleep at the first breast (at least 10 to 20 minutes), then offer the second side.   The baby is wetting 5 to 6 disposable diapers (6 to 8 cloth diapers) in a 24 hour period by 5 to 6 days of age.   The baby is having at least 2 to 3 stools every 24 hours for the first few months. Breast milk is all the food your baby needs. It is not necessary for your baby to have water or formula. In fact, to help your breasts make more milk, it is best not to give your baby supplemental feedings during the early weeks.   The stool should be soft and yellow.   The baby should gain 4 to 7 ounces per week after he is 4 days old.  TAKE CARE OF YOURSELF Take care of your breasts by:  Bathing or showering daily.   Avoiding the use of soaps on your nipples.   Start feedings on your left breast at one feeding and on your right breast at the next feeding.   You will notice an increase in your milk supply 2 to 5 days after delivery. You may feel some discomfort from engorgement, which makes your breasts very firm and often tender. Engorgement "peaks" out within 24 to 48 hours. In the meantime, apply warm moist towels to your breasts for 5 to 10 minutes before feeding. Gentle massage and expression of some milk before feeding will soften your breasts, making  it easier for your baby to latch on. Wear a well fitting nursing bra and air dry your nipples for 10 to 15 minutes after each feeding.   Only use cotton bra pads.   Only use pure lanolin on your nipples after nursing. You do not need to wash it off before nursing.  Take care of yourself by:   Eating well-balanced meals and nutritious snacks.   Drinking milk, fruit juice, and water to satisfy your thirst (about 8 glasses a day).   Getting plenty of rest.   Increasing calcium in   your diet (1200 mg a day).   Avoiding foods that you notice affect the baby in a bad way.  SEEK MEDICAL CARE IF:   You have any questions or difficulty with breastfeeding.   You need help.   You have a hard, red, sore area on your breast, accompanied by a fever of 100.5 F (38.1 C) or more.   Your baby is too sleepy to eat well or is having trouble sleeping.   Your baby is wetting less than 6 diapers per day, by 5 days of age.   Your baby's skin or white part of his or her eyes is more yellow than it was in the hospital.   You feel depressed.  Document Released: 03/15/2005 Document Revised: 03/04/2011 Document Reviewed: 10/28/2008 Exit[employer] Patient Information 2012 [employer]. Vaginal Delivery Care After  Change your pad on each trip to the bathroom.   Wipe gently with toilet paper during your hospital stay. Always wipe from front to back. A spray bottle with warm tap water could also be used or a towelette if available.   Place your soiled pad and toilet paper in a bathroom wastebasket with a plastic bag liner.   During your hospital stay, save any clots. If you pass a clot while on the toilet, do not flush it. Also, if your vaginal flow seems excessive to you, notify nursing personnel.   The first time you get out of bed after delivery, wait for assistance from a nurse. Do not get up alone at any time if you feel weak or dizzy.   Bend and extend your ankles forcefully so that you feel the  calves of your legs get hard. Do this 6 times every hour when you are in bed and awake.   Do not sit with one foot under you, dangle your legs over the edge of the bed, or maintain a position that hinders the circulation in your legs.   Many women experience after pains for 2 to 3 days after delivery. These after pains are mild uterine contractions. Ask the nurse for a pain medication if you need something for this. Sometimes breastfeeding stimulates after pains; if you find this to be true, ask for the medication  -  hour before the next feeding.   For you and your infant's protection, do not go beyond the door(s) of the obstetric unit. Do not carry your baby in your arms in the hallway. When taking your baby to and from your room, put your baby in the bassinet and push the bassinet.   Mothers may have their babies in their room as much as they desire.  Document Released: 03/12/2000 Document Revised: 03/04/2011 Document Reviewed: 02/10/2007 [employer] Patient Information 2012 [employer]. 

## 2011-07-01 NOTE — Discharge Summary (Signed)
Physician Discharge Summary  Patient ID: Megan Dickerson MRN: [id number] DOB/AGE: [birth year] 30 y.o. 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     Admit date: 06/29/2011 Discharge date: 07/01/2011  Admission Diagnoses: term pg, active labor  Discharge Diagnoses:  Active Problems:  Vaginal delivery normal involution lactating  Discharged Condition: stable  Hospital Course: active labor, ARM, IV pitocin, SVD, intact perineum, normal involution  Consults: None  Significant Diagnostic Studies: labs: Hemoglobin & Hematocrit     Component Value Date/Time   HGB 10.7* 06/30/2011 0538   HCT 32.3* 06/30/2011 0538     Treatments: IV hydration  Discharge Exam: Blood pressure 90/50, pulse 63, temperature 97.7 F (36.5 C), temperature source Oral, resp. rate 18, height 5\' 3"  (1.6 m), weight 82.611 kg (182 lb 2 oz), SpO2 98.00%, unknown if currently breastfeeding. General appearance: alert, cooperative and no distress S: c/o "feeling achy pushed x1/ 2 hour not crampy more like a muscle ache" and points up and down right side, denies N,V,D, constipation, not crampy, relief with percocet O VSS      Lungs clear bilaterally, AP RRR, abd soft, gravid, nt on palpation, no guarding, ff 2 below U, small serosa flow, perineum intact, -Homans sign bilaterally, no edema Disposition:  home Discharge Orders    Future Orders Please Complete By Expires   Discharge instructions      Comments:   [facility] booklet   Strep B DNA probe      Comments:   This external order was created through the Results Console.   HIV antibody      Comments:   This external order was created through the Results Console.   Rubella antibody, IgM      Comments:   This external order was created through the Results Console.   Hepatitis B surface antigen      Comments:   This external order was created through the Results  Console.   RPR      Comments:   This external order was created through the Results Console.   GC/chlamydia probe amp, genital      Comments:   This external order was created through the Results Console.   Antibody screen      Comments:   This external order was created through the Results Console.   ABO/Rh      Comments:   This external order was created through the Results Console.     Medication List  As of 07/01/2011  8:47 AM   TAKE these medications         ibuprofen 600 MG tablet   Commonly known as: ADVIL,MOTRIN   Take 1 tablet (600 mg total) by mouth every 6 (six) hours as needed for pain.      loratadine 10 MG tablet   Commonly known as: CLARITIN   Take 10 mg by mouth daily. Over the counter purchase      norethindrone 0.35 MG tablet   Commonly known as: MICRONOR,CAMILA,ERRIN   Take 1 tablet (0.35 mg total) by mouth daily.      oxyCODONE-acetaminophen 5-325 MG per tablet   Commonly known as: PERCOCET   Take 1-2 tablets by mouth every 3 (three) hours as needed (moderate - severe pain).      prenatal multivitamin Tabs   Take 1 tablet by mouth daily.      ranitidine 75  MG tablet   Commonly known as: ZANTAC   Take 75 mg by mouth 2 (two) times daily. Over the counter purchase           Follow-up Information    Follow up with [facility] in 6 weeks.       plans micronor, RX discussed.  SignedLavera Guise 07/01/2011, 8:47 AM

## 2011-07-05 ENCOUNTER — Encounter: Payer: BC Managed Care – PPO | Admitting: Obstetrics and Gynecology

## 2011-08-11 ENCOUNTER — Encounter: Payer: Self-pay | Admitting: Obstetrics and Gynecology

## 2011-08-11 ENCOUNTER — Ambulatory Visit (INDEPENDENT_AMBULATORY_CARE_PROVIDER_SITE_OTHER): Payer: BC Managed Care – PPO | Admitting: Obstetrics and Gynecology

## 2011-08-11 VITALS — BP 92/68 | Temp 98.3°F | Resp 16 | Ht 63.5 in | Wt 162.0 lb

## 2011-08-11 DIAGNOSIS — Z8742 Personal history of other diseases of the female genital tract: Secondary | ICD-10-CM

## 2011-08-11 DIAGNOSIS — Z87898 Personal history of other specified conditions: Secondary | ICD-10-CM | POA: Insufficient documentation

## 2011-08-11 DIAGNOSIS — Z01419 Encounter for gynecological examination (general) (routine) without abnormal findings: Secondary | ICD-10-CM

## 2011-08-11 NOTE — Progress Notes (Signed)
Date of delivery: 06/29/2011 Female Name: Megan Dickerson Vaginal delivery: yes Cesarean section: no Tubal ligation: no GDM: no Breast Feeding: yes Bottle Feeding: no Post-Partum Blues: no Abnormal pap: yes Normal GU function: yes Normal GI function: yes Returning to work: no  Wants to discuss contraception options (on mini-pill right now)  EPDS- 8

## 2011-08-11 NOTE — Progress Notes (Signed)
Subjective:     Megan Dickerson is a 31 y.o. female who presents for a postpartum visit.  I have fully reviewed the prenatal and intrapartum course. Patient adjusting to 5 children--2nd youngest is aggressive toward baby, but lovingly aggressive. Patient denies PP depression.  PPDS = 8, but doesn't feel having any unexpected issues.  Has good support from family and friends.  Had LG SIL on bx 12/12--due pap today.  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  Patient is not sexually active.   The following portions of the patient's history were reviewed and updated as appropriate: allergies, current medications, past family history, past medical history, past social history, past surgical history and problem list.  Review of Systems Pertinent items are noted in HPI.   Objective:    BP 92/68  Temp 98.3 F (36.8 C)  Resp 16  Ht 5' 3.5" (1.613 m)  Wt 162 lb (73.483 kg)  BMI 28.25 kg/m2  Breastfeeding? Yes  General:  alert, cooperative and no distress     Lungs: clear to auscultation bilaterally  Heart:  regular rate and rhythm, S1, S2 normal, no murmur  Abdomen: soft, non-tender; bowel sounds normal; no masses,  no organomegaly   Vulva:  normal  Vagina: normal vagina  Cervix:  normal  Corpus: normal size, contour, position, consistency, mobility, non-tender  Adnexa:  normal adnexa             Assessment:     Normal postpartum exam.  Pap smear done at today's visit. --due to CIN 1 12/12 Wants Mirena  Plan:     1. Contraception: Plans Mirena until vasectomy 2. Follow up in: 1 week or as soon as possible for Mirena 3. Will f/u based on pap result 4..Encouraged patient to utilize resources for help with children.  Marland Kitchen    Nigel Bridgeman MD 08/11/2011 2:28 PM

## 2011-08-16 LAB — PAP IG W/ RFLX HPV ASCU

## 2011-09-07 ENCOUNTER — Encounter: Payer: BC Managed Care – PPO | Admitting: Obstetrics and Gynecology

## 2011-09-07 ENCOUNTER — Ambulatory Visit (INDEPENDENT_AMBULATORY_CARE_PROVIDER_SITE_OTHER): Payer: BC Managed Care – PPO | Admitting: Obstetrics and Gynecology

## 2011-09-07 ENCOUNTER — Encounter: Payer: Self-pay | Admitting: Obstetrics and Gynecology

## 2011-09-07 VITALS — BP 90/58 | Wt 162.0 lb

## 2011-09-07 DIAGNOSIS — IMO0001 Reserved for inherently not codable concepts without codable children: Secondary | ICD-10-CM

## 2011-09-07 DIAGNOSIS — Z3009 Encounter for other general counseling and advice on contraception: Secondary | ICD-10-CM

## 2011-09-07 DIAGNOSIS — Z30430 Encounter for insertion of intrauterine contraceptive device: Secondary | ICD-10-CM

## 2011-09-07 LAB — POCT URINE PREGNANCY: Preg Test, Ur: NEGATIVE

## 2011-09-07 MED ORDER — LEVONORGESTREL 20 MCG/24HR IU IUD
INTRAUTERINE_SYSTEM | Freq: Once | INTRAUTERINE | Status: AC
  Administered 2011-09-07: 1 via INTRAUTERINE

## 2011-09-07 NOTE — Progress Notes (Signed)
IUD: Mirena  LOT#: [id number] Exp: 09/15 UPT: negative GC/CHLAMYDIA: done CONSENT SIGNED: yes DISINFECTION WITH betadine X3 UTERUS SOUNDED AT 8 CM IUD INSERTED PER PROTOCOL: yes COMPLICATION: none PATIENT INSTRUCTED TO CALL IS FEVER OR ABNORMAL PAIN:yes PATIENT INSTRUCTED ON HOW TO CHECK IUD STRINGS: no FOLLOW UP APPT: 4-5 weeks

## 2011-09-07 NOTE — Patient Instructions (Signed)
Intrauterine Device Insertion Most often, an intrauterine device (IUD) is inserted into the uterus to prevent pregnancy. There are 2 types of IUDs available:  Copper IUD. This type of IUD creates an environment that is not favorable to sperm survival. The mechanism of action of the copper IUD is not known for certain. It can stay in place for 10 years.   Hormone IUD. This type of IUD contains the hormone progestin (synthetic progesterone). The progestin thickens the cervical mucus and prevents sperm from entering the uterus, and it also thins the uterine lining. There is no evidence that the hormone IUD prevents implantation. The hormone IUD can stay in place for up to 5 years.  An IUD is the most cost-effective birth control if left in place for the full duration. It may be removed at any time. LET YOUR CAREGIVER KNOW ABOUT:  Sensitivity to metals.   Medicines taken including herbs, eyedrops, over-the-counter medicines, and creams.   Use of steroids (by mouth or creams).   Previous problems with anesthetics or numbing medicine.   Previous gynecological surgery.   History of blood clots or clotting disorders.   Possibility of pregnancy.   Menstrual irregularities.   Concerns regarding unusual vaginal discharge or odors.   Previous experience with an IUD.   Other health problems.  RISKS AND COMPLICATIONS  Accidental puncture (perforation) of the uterus.   Accidental placement of the IUD either in the muscle layer of the uterus (myometrium) or outside the uterus. If this happen, the IUD can be found essentially floating around the bowels. When this happens, the IUD must be taken out surgically.   The IUD may fall out of the uterus (expulsion). This is more common in women who have recently had a child.    Pregnancy in the fallopian tube (ectopic).  BEFORE THE PROCEDURE  Schedule the IUD insertion for when you will have your menstrual period or right after, to make sure you  are not pregnant. Placement of the IUD is better tolerated shortly after a menstrual cycle.   You may need to take tests or be examined to make sure you are not pregnant.   You may be required to take a pregnancy test.   You may be required to get checked for sexually transmitted infections (STIs) prior to placement. Placing an IUD in someone who has an infection can make an infection worse.   You may be given a pain reliever to take 1 or 2 hours before the procedure.   An exam will be performed to determine the size and position of your uterus.   Ask your caregiver about changing or stopping your regular medicines.  PROCEDURE   A tool (speculum) is placed in the vagina. This allows your caregiver to see the lower part of the uterus (cervix).   The cervix is prepped with a medicine that lowers the risk of infection.   You may be given a medicine to numb each side of the cervix (intracervical or paracervical block). This is used to block and control any discomfort with insertion.   A tool (uterine sound) is inserted into the uterus to determine the length of the uterine cavity and the direction the uterus may be tilted.   A slim instrument (IUD inserter) is inserted through the cervical canal and into your uterus.   The IUD is placed in the uterine cavity and the insertion device is removed.   The nylon string that is attached to the IUD, and used for   eventual IUD removal, is trimmed. It is trimmed so that it lays high in the vagina, just outside the cervix.  AFTER THE PROCEDURE  You may have bleeding after the procedure. This is normal. It varies from light spotting for a few days to menstrual-like bleeding.   You may have mild cramping.   Practice checking the string coming out of the cervix to make sure the IUD remains in the uterus. If you cannot feel the string, you should schedule a "string check" with your caregiver.   If you had a hormone IUD inserted, expect that your  period may be lighter or nonexistent within a year's time (though this is not always the case). There may be delayed fertility with the hormone IUD as a result of its progesterone effect. When you are ready to become pregnant, it is suggested to have the IUD removed up to 1 year in advance.   Yearly exams are advised.  Document Released: 11/11/2010 Document Revised: 03/04/2011 Document Reviewed: 11/11/2010 [employer] Patient Information 2012 [employer]. 

## 2011-09-08 LAB — GC/CHLAMYDIA PROBE AMP, GENITAL: GC Probe Amp, Genital: NEGATIVE

## 2011-10-04 ENCOUNTER — Ambulatory Visit (INDEPENDENT_AMBULATORY_CARE_PROVIDER_SITE_OTHER): Payer: BC Managed Care – PPO | Admitting: Obstetrics and Gynecology

## 2011-10-04 ENCOUNTER — Encounter: Payer: Self-pay | Admitting: Obstetrics and Gynecology

## 2011-10-04 VITALS — BP 90/60 | Wt 158.0 lb

## 2011-10-04 DIAGNOSIS — Z30431 Encounter for routine checking of intrauterine contraceptive device: Secondary | ICD-10-CM

## 2011-10-04 DIAGNOSIS — Z87898 Personal history of other specified conditions: Secondary | ICD-10-CM

## 2011-10-04 DIAGNOSIS — Z8742 Personal history of other diseases of the female genital tract: Secondary | ICD-10-CM

## 2011-10-04 NOTE — Progress Notes (Signed)
Complaints: Pt states she has experienced spotting daily since insertion. Pt c/o occasional cramping.  Strings Visualized:yes Normal Bimanual:yes Other: pt taught to check for strings and reassured about the spotting Follow up with [facility]:yes Other:

## 2013-01-26 ENCOUNTER — Ambulatory Visit (INDEPENDENT_AMBULATORY_CARE_PROVIDER_SITE_OTHER): Payer: BC Managed Care – PPO | Admitting: Internal Medicine

## 2013-01-26 ENCOUNTER — Ambulatory Visit: Payer: BC Managed Care – PPO

## 2013-01-26 VITALS — BP 102/68 | HR 66 | Temp 99.5°F | Resp 16 | Ht 63.5 in | Wt 138.0 lb

## 2013-01-26 DIAGNOSIS — F411 Generalized anxiety disorder: Secondary | ICD-10-CM

## 2013-01-26 DIAGNOSIS — T189XXA Foreign body of alimentary tract, part unspecified, initial encounter: Secondary | ICD-10-CM

## 2013-01-26 NOTE — Progress Notes (Signed)
  Subjective:    Patient ID: Megan Dickerson, female    DOB: Feb 13, 1981, 32 y.o.   MRN: [id number]  HPI At home sewing and had sew needles in her mouth and think she swallowed one. Has no pain or sxs.   Review of Systems healthy    Objective:   Physical Exam  Vitals reviewed. Constitutional: She is oriented to person, place, and time. She appears well-developed and well-nourished. No distress.  HENT:  Head: Normocephalic.  Nose: Nose normal.  Mouth/Throat: Oropharynx is clear and moist.  Eyes: EOM are normal.  Neck: Normal range of motion. Neck supple. No tracheal deviation present. No thyromegaly present.  Pulmonary/Chest: Effort normal.  Musculoskeletal: Normal range of motion.  Lymphadenopathy:    She has no cervical adenopathy.  Neurological: She is alert and oriented to person, place, and time. She exhibits normal muscle tone. Coordination normal.  Psychiatric: Her mood appears anxious.    [facility] reading (PRIMARY) by  Dr Perrin Maltese. May have swallowed sewing needle, see L2 density, ? needle        Assessment & Plan:  Anxiety/Possible FB none seen by radiology.

## 2013-01-26 NOTE — Patient Instructions (Signed)
Swallowed Foreign Body, Adult °You have swallowed an object (foreign body). Once the foreign body has passed through the food tube (esophagus), which leads from the mouth to the stomach, it will usually continue through the body without problems. This is because the point where the esophagus enters into the stomach is the narrowest place through which the foreign body must pass. °Sometimes the foreign body gets stuck. The most common type of foreign body obstruction in adults is food impaction. Many times, bones from fish or meat products may become lodged in the esophagus or injure the throat on the way down. When there is an object that obstructs the esophagus, the most obvious symptoms are pain and the inability to swallow normally. In some cases, foreign bodies that can be life threatening are swallowed. Examples of these are certain medications and illicit drugs. Often in these instances, patients are afraid of telling what they swallowed. However, it is extremely important to tell the emergency caregiver what was swallowed because life-saving treatment may be needed.  °X-ray exams may be taken to find the location of the foreign body. However, some objects do not show up well or may be too small to be seen on an X-ray image. If the foreign body is too large or too sharp, it may be too dangerous to allow it to pass on its own. You may need to see a caregiver who specializes in the digestive system (gastroenterologist). In a few cases, a specialist may need to remove the object using a method called "endoscopy".  This involves passing a thin, soft, flexible tube into the food pipe to locate and remove the object. Follow up with your primary doctor or the referral you were given by the emergency caregiver. °HOME CARE INSTRUCTIONS  °· If your caregiver says it is safe for you to eat, then only have liquids and soft foods until your symptoms improve. °· Once you are eating normally: °· Cut food into small  pieces. °· Remove small bones from food. °· Remove large seeds and pits from fruit. °· Chew your food well. °· Do not talk, laugh, or engage in physical activity while eating or swallowing. °SEEK MEDICAL CARE IF: °· You develop worsening shortness of breath, uncontrollable coughing, chest pains or high fever, greater than 102° F (38.9° C). °· You are unable to eat or drink or you feel that food is getting stuck in your throat. °· You have choking symptoms or cannot stop drooling. °· You develop abdominal pain, vomiting (especially of blood), or rectal bleeding. °MAKE SURE YOU:  °· Understand these instructions. °· Will watch your condition. °· Will get help right away if you are not doing well or get worse. °Document Released: 09/02/2009 Document Revised: 06/07/2011 Document Reviewed: 09/02/2009 °Exit[employer]® Patient Information ©2014 [employer]. ° °

## 2014-11-11 IMAGING — CR DG CHEST 1V
1 series · 1 of 1 positions shown · non-contrast
Comparison: None.

CLINICAL DATA: Swallowed straight pin

EXAM:
CHEST - 1 VIEW

[PA]
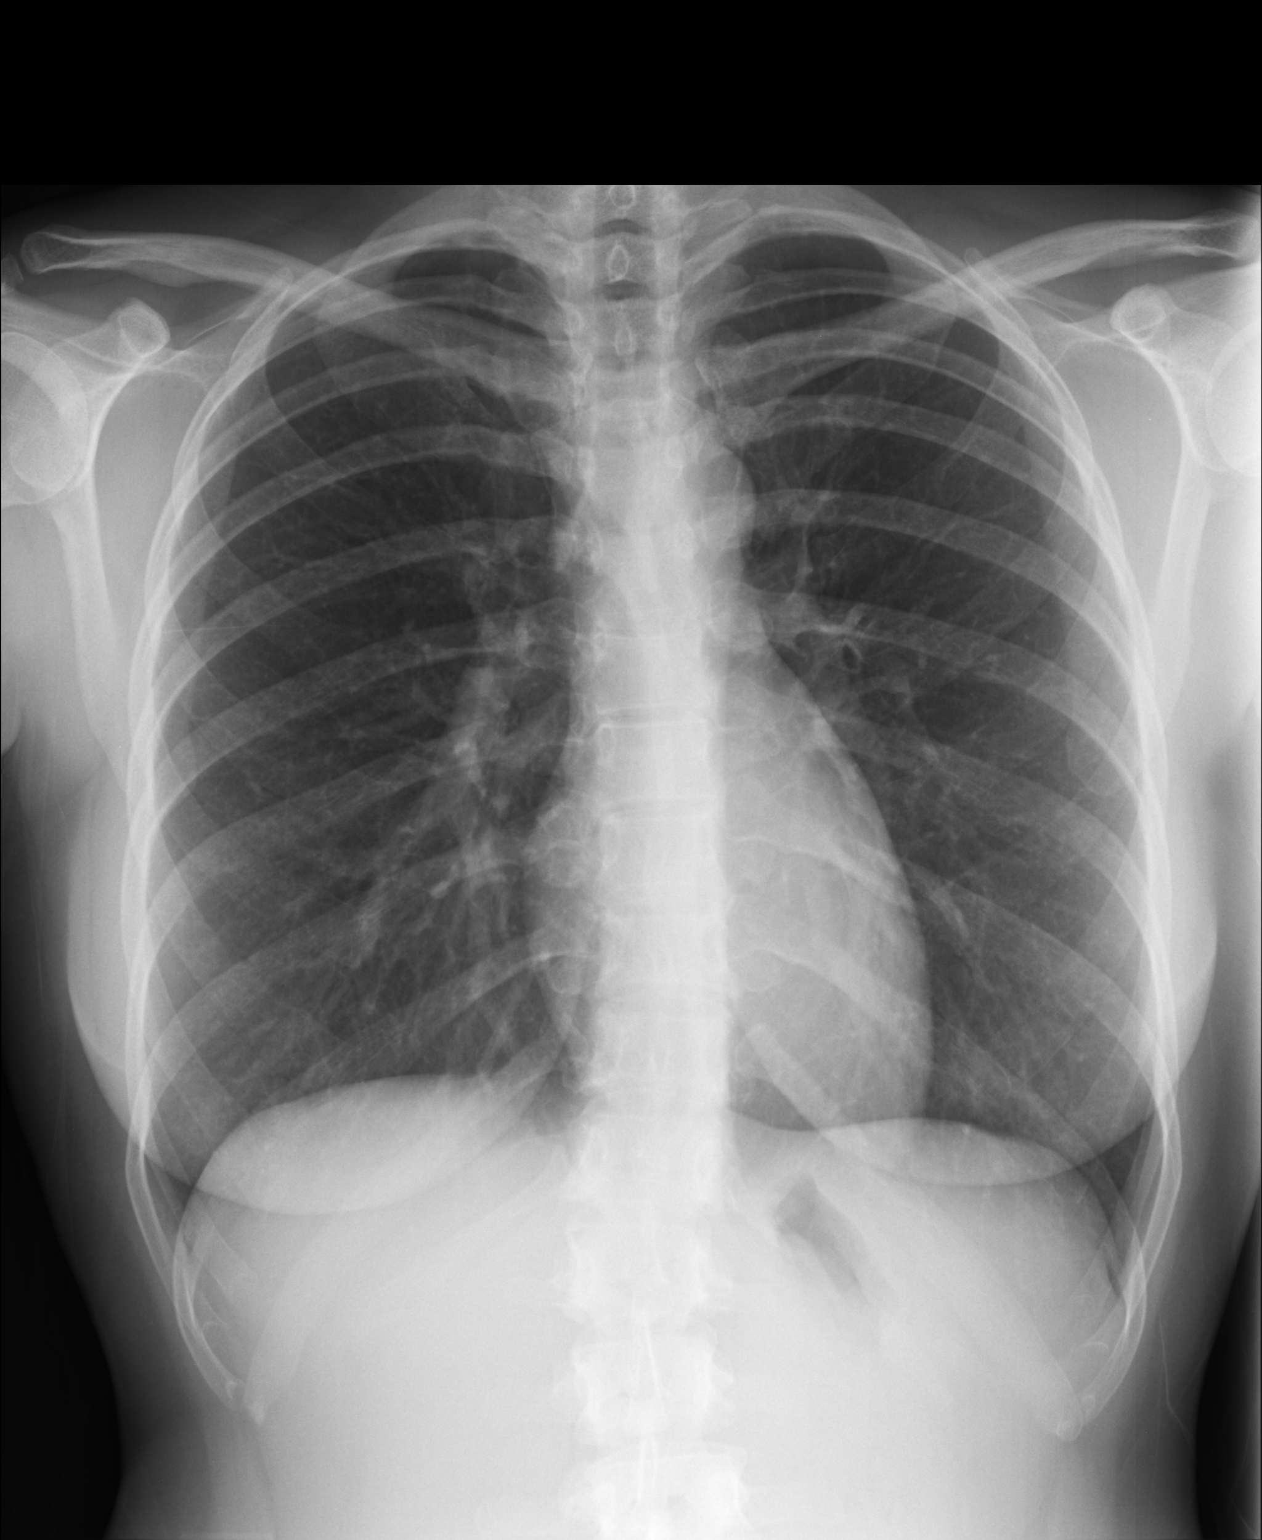

[1 of 1 positions shown; findings below may reference images not displayed]

FINDINGS: The lungs are clear. Heart size and pulmonary vascularity are
normal. No adenopathy. No pneumothorax. No bony lesions. No
radiopaque foreign body is appreciable on this study.
IMPRESSION: No radiopaque foreign body appreciable on this study. Lungs clear.
No pneumothorax.

Consideration may wish to be given to abdomen image to further
evaluate for potential swallowed foreign body.

## 2019-07-05 ENCOUNTER — Ambulatory Visit: Payer: Self-pay | Attending: Internal Medicine

## 2019-07-05 DIAGNOSIS — Z23 Encounter for immunization: Secondary | ICD-10-CM

## 2019-07-05 NOTE — Progress Notes (Signed)
   Covid-19 Vaccination Clinic  Name:  Megan Dickerson    MRN: [id number] DOB: 1980/06/15  07/05/2019  [name] was observed post Covid-19 immunization for 15 minutes without incident. She was provided with Vaccine Information Sheet and instruction to access the V-Safe system.   [name] was instructed to call 911 with any severe reactions post vaccine: Marland Kitchen Difficulty breathing  . Swelling of face and throat  . A fast heartbeat  . A bad rash all over body  . Dizziness and weakness   Immunizations Administered    Name Date Dose VIS Date Route   Pfizer COVID-19 Vaccine 07/05/2019 10:10 AM 0.3 mL 03/09/2019 Intramuscular   Manufacturer: [employer], [employer]   Lot: [id number]   NDC: [address]-0

## 2019-07-16 DIAGNOSIS — Z85820 Personal history of malignant melanoma of skin: Secondary | ICD-10-CM | POA: Diagnosis not present

## 2019-07-16 DIAGNOSIS — D2271 Melanocytic nevi of right lower limb, including hip: Secondary | ICD-10-CM | POA: Diagnosis not present

## 2019-07-16 DIAGNOSIS — Z1283 Encounter for screening for malignant neoplasm of skin: Secondary | ICD-10-CM | POA: Diagnosis not present

## 2019-07-16 DIAGNOSIS — Z08 Encounter for follow-up examination after completed treatment for malignant neoplasm: Secondary | ICD-10-CM | POA: Diagnosis not present

## 2019-07-16 DIAGNOSIS — D485 Neoplasm of uncertain behavior of skin: Secondary | ICD-10-CM | POA: Diagnosis not present

## 2019-07-16 DIAGNOSIS — D225 Melanocytic nevi of trunk: Secondary | ICD-10-CM | POA: Diagnosis not present

## 2019-07-30 ENCOUNTER — Ambulatory Visit: Payer: Self-pay | Attending: Internal Medicine

## 2019-07-30 DIAGNOSIS — Z23 Encounter for immunization: Secondary | ICD-10-CM

## 2019-07-30 NOTE — Progress Notes (Signed)
   Covid-19 Vaccination Clinic  Name:  Megan Dickerson    MRN: [id number] DOB: January 23, 1981  07/30/2019  Megan Dickerson was observed post Covid-19 immunization for 15 minutes without incident. She was provided with Vaccine Information Sheet and instruction to access the V-Safe system.   Megan Dickerson was instructed to call 911 with any severe reactions post vaccine: Marland Kitchen Difficulty breathing  . Swelling of face and throat  . A fast heartbeat  . A bad rash all over body  . Dizziness and weakness   Immunizations Administered    Name Date Dose VIS Date Route   Pfizer COVID-19 Vaccine 07/30/2019 10:10 AM 0.3 mL 05/23/2018 Intramuscular   Manufacturer: [employer], [employer]   Lot: [id number]   NDC: [address]-1

## 2019-08-01 DIAGNOSIS — D485 Neoplasm of uncertain behavior of skin: Secondary | ICD-10-CM | POA: Diagnosis not present

## 2019-08-01 DIAGNOSIS — D225 Melanocytic nevi of trunk: Secondary | ICD-10-CM | POA: Diagnosis not present

## 2019-08-01 DIAGNOSIS — L988 Other specified disorders of the skin and subcutaneous tissue: Secondary | ICD-10-CM | POA: Diagnosis not present

## 2019-08-13 DIAGNOSIS — D485 Neoplasm of uncertain behavior of skin: Secondary | ICD-10-CM | POA: Diagnosis not present

## 2019-08-13 DIAGNOSIS — L988 Other specified disorders of the skin and subcutaneous tissue: Secondary | ICD-10-CM | POA: Diagnosis not present

## 2019-08-13 DIAGNOSIS — D225 Melanocytic nevi of trunk: Secondary | ICD-10-CM | POA: Diagnosis not present

## 2019-10-08 DIAGNOSIS — Z1283 Encounter for screening for malignant neoplasm of skin: Secondary | ICD-10-CM | POA: Diagnosis not present

## 2019-10-08 DIAGNOSIS — Z08 Encounter for follow-up examination after completed treatment for malignant neoplasm: Secondary | ICD-10-CM | POA: Diagnosis not present

## 2019-10-08 DIAGNOSIS — Z85820 Personal history of malignant melanoma of skin: Secondary | ICD-10-CM | POA: Diagnosis not present

## 2019-10-08 DIAGNOSIS — D2271 Melanocytic nevi of right lower limb, including hip: Secondary | ICD-10-CM | POA: Diagnosis not present

## 2019-10-08 DIAGNOSIS — D225 Melanocytic nevi of trunk: Secondary | ICD-10-CM | POA: Diagnosis not present

## 2019-10-08 DIAGNOSIS — D485 Neoplasm of uncertain behavior of skin: Secondary | ICD-10-CM | POA: Diagnosis not present

## 2019-10-23 DIAGNOSIS — L988 Other specified disorders of the skin and subcutaneous tissue: Secondary | ICD-10-CM | POA: Diagnosis not present

## 2019-10-23 DIAGNOSIS — D485 Neoplasm of uncertain behavior of skin: Secondary | ICD-10-CM | POA: Diagnosis not present

## 2019-12-06 DIAGNOSIS — H5203 Hypermetropia, bilateral: Secondary | ICD-10-CM | POA: Diagnosis not present

## 2020-01-09 DIAGNOSIS — Z85820 Personal history of malignant melanoma of skin: Secondary | ICD-10-CM | POA: Diagnosis not present

## 2020-01-09 DIAGNOSIS — Z1283 Encounter for screening for malignant neoplasm of skin: Secondary | ICD-10-CM | POA: Diagnosis not present

## 2020-01-09 DIAGNOSIS — D225 Melanocytic nevi of trunk: Secondary | ICD-10-CM | POA: Diagnosis not present

## 2020-01-09 DIAGNOSIS — Z08 Encounter for follow-up examination after completed treatment for malignant neoplasm: Secondary | ICD-10-CM | POA: Diagnosis not present

## 2020-04-23 DIAGNOSIS — D225 Melanocytic nevi of trunk: Secondary | ICD-10-CM | POA: Diagnosis not present

## 2020-04-23 DIAGNOSIS — Z85820 Personal history of malignant melanoma of skin: Secondary | ICD-10-CM | POA: Diagnosis not present

## 2020-04-23 DIAGNOSIS — Z1283 Encounter for screening for malignant neoplasm of skin: Secondary | ICD-10-CM | POA: Diagnosis not present

## 2020-04-23 DIAGNOSIS — Z08 Encounter for follow-up examination after completed treatment for malignant neoplasm: Secondary | ICD-10-CM | POA: Diagnosis not present

## 2020-07-23 DIAGNOSIS — Z85820 Personal history of malignant melanoma of skin: Secondary | ICD-10-CM | POA: Diagnosis not present

## 2020-07-23 DIAGNOSIS — D225 Melanocytic nevi of trunk: Secondary | ICD-10-CM | POA: Diagnosis not present

## 2020-07-23 DIAGNOSIS — Z08 Encounter for follow-up examination after completed treatment for malignant neoplasm: Secondary | ICD-10-CM | POA: Diagnosis not present

## 2020-07-23 DIAGNOSIS — Z1283 Encounter for screening for malignant neoplasm of skin: Secondary | ICD-10-CM | POA: Diagnosis not present

## 2020-10-15 DIAGNOSIS — Z1283 Encounter for screening for malignant neoplasm of skin: Secondary | ICD-10-CM | POA: Diagnosis not present

## 2020-10-15 DIAGNOSIS — Z08 Encounter for follow-up examination after completed treatment for malignant neoplasm: Secondary | ICD-10-CM | POA: Diagnosis not present

## 2020-10-15 DIAGNOSIS — L905 Scar conditions and fibrosis of skin: Secondary | ICD-10-CM | POA: Diagnosis not present

## 2020-10-15 DIAGNOSIS — Z85820 Personal history of malignant melanoma of skin: Secondary | ICD-10-CM | POA: Diagnosis not present

## 2021-01-07 DIAGNOSIS — Z08 Encounter for follow-up examination after completed treatment for malignant neoplasm: Secondary | ICD-10-CM | POA: Diagnosis not present

## 2021-01-07 DIAGNOSIS — Z85820 Personal history of malignant melanoma of skin: Secondary | ICD-10-CM | POA: Diagnosis not present

## 2021-01-07 DIAGNOSIS — D225 Melanocytic nevi of trunk: Secondary | ICD-10-CM | POA: Diagnosis not present

## 2021-01-07 DIAGNOSIS — Z1283 Encounter for screening for malignant neoplasm of skin: Secondary | ICD-10-CM | POA: Diagnosis not present

## 2021-04-08 DIAGNOSIS — Z08 Encounter for follow-up examination after completed treatment for malignant neoplasm: Secondary | ICD-10-CM | POA: Diagnosis not present

## 2021-04-08 DIAGNOSIS — Z1283 Encounter for screening for malignant neoplasm of skin: Secondary | ICD-10-CM | POA: Diagnosis not present

## 2021-04-08 DIAGNOSIS — Z85820 Personal history of malignant melanoma of skin: Secondary | ICD-10-CM | POA: Diagnosis not present

## 2021-05-10 ENCOUNTER — Encounter (HOSPITAL_BASED_OUTPATIENT_CLINIC_OR_DEPARTMENT_OTHER): Payer: Self-pay

## 2021-05-10 ENCOUNTER — Emergency Department (HOSPITAL_BASED_OUTPATIENT_CLINIC_OR_DEPARTMENT_OTHER)
Admission: EM | Admit: 2021-05-10 | Discharge: 2021-05-10 | Disposition: A | Discharge status: Home / Self Care | Payer: BC Managed Care – PPO | Attending: Emergency Medicine | Admitting: Emergency Medicine

## 2021-05-10 ENCOUNTER — Other Ambulatory Visit: Payer: Self-pay

## 2021-05-10 ENCOUNTER — Emergency Department (HOSPITAL_BASED_OUTPATIENT_CLINIC_OR_DEPARTMENT_OTHER): Payer: BC Managed Care – PPO | Admitting: Radiology

## 2021-05-10 DIAGNOSIS — I10 Essential (primary) hypertension: Secondary | ICD-10-CM | POA: Diagnosis not present

## 2021-05-10 DIAGNOSIS — R0789 Other chest pain: Secondary | ICD-10-CM | POA: Insufficient documentation

## 2021-05-10 DIAGNOSIS — R002 Palpitations: Secondary | ICD-10-CM

## 2021-05-10 DIAGNOSIS — R0602 Shortness of breath: Secondary | ICD-10-CM | POA: Diagnosis not present

## 2021-05-10 DIAGNOSIS — R079 Chest pain, unspecified: Secondary | ICD-10-CM

## 2021-05-10 DIAGNOSIS — R000 Tachycardia, unspecified: Secondary | ICD-10-CM | POA: Diagnosis not present

## 2021-05-10 LAB — CBC
HCT: 40.5 % (ref 36.0–46.0)
Hemoglobin: 13.7 g/dL (ref 12.0–15.0)
MCH: 29.8 pg (ref 26.0–34.0)
MCHC: 33.8 g/dL (ref 30.0–36.0)
MCV: 88.2 fL (ref 80.0–100.0)
Platelets: 240 10*3/uL (ref 150–400)
RBC: 4.59 MIL/uL (ref 3.87–5.11)
RDW: 12.3 % (ref 11.5–15.5)
WBC: 7.2 10*3/uL (ref 4.0–10.5)
nRBC: 0 % (ref 0.0–0.2)

## 2021-05-10 LAB — BASIC METABOLIC PANEL
Anion gap: 10 (ref 5–15)
BUN: 11 mg/dL (ref 6–20)
CO2: 25 mmol/L (ref 22–32)
Calcium: 9.7 mg/dL (ref 8.9–10.3)
Chloride: 105 mmol/L (ref 98–111)
Creatinine, Ser: 0.8 mg/dL (ref 0.44–1.00)
GFR, Estimated: >60 mL/min (ref >60)
Glucose, Bld: 104 mg/dL — ABNORMAL HIGH (ref 70–99)
Potassium: 3.5 mmol/L (ref 3.5–5.1)
Sodium: 140 mmol/L (ref 135–145)

## 2021-05-10 LAB — PREGNANCY, URINE: Preg Test, Ur: NEGATIVE

## 2021-05-10 LAB — TROPONIN I (HIGH SENSITIVITY): Troponin I (High Sensitivity): <2 ng/L (ref <18)

## 2021-05-10 NOTE — ED Notes (Signed)
Patient transported to X-ray 

## 2021-05-10 NOTE — ED Triage Notes (Signed)
Patient here POV from Home with Hypertension.   Patient endorses High HR in the AM and BP was High in the Afternoon.   Endorses Tightness Sensation in Chest and Mild to Moderate SOB.  NAD noted during Triage. Ambulatory. A&Ox4. GCS 15.

## 2021-05-10 NOTE — ED Provider Notes (Signed)
[facility] EMERGENCY DEPT Provider Note   CSN: [id number] Arrival date & time: 05/10/21  2031     History  Chief Complaint  Patient presents with   Hypertension    Megan Dickerson is a 41 y.o. female with no significant past medical history presents with concern for high blood pressure, high heart rate, some chest tightness, pressure, feeling of lightheadedness, as well as some shortness of breath.  Patient denies any personal history of ACS, stroke, diabetes, hypertension, hyperlipidemia.  Patient denies tobacco use.  Patient denies any first-degree family history of CAD, or ACS.  Patient reports history of melanoma, and C-sections, but does not take any medications.  Patient reports that she was sitting in church, felt a sort of lightheaded sensation, felt like her heart rate was elevated.  She took her blood pressure multiple times throughout the day, it was as elevated as 148/90, and her pulse was up into the high 80s and low 90s.  Patient reports that she has had some scattered chest tingling, as well as 1-2 episodes of chest pressure pain.  She reports that the last episode of chest pain was in the car on the way over here.  She does endorse some bilateral arm tingling earlier, and then reports that it feels like it localized to the left arm.  She reports that she has maybe had this sensation in the past.  Patient denies exertional chest pain.  Patient denies any nausea, vomiting, diaphoresis.  Patient reports that someone who listened to her heart earlier today reports that she had an irregular rhythm.   Hypertension Associated symptoms include chest pain and shortness of breath.      Home Medications Prior to Admission medications   Medication Sig Start Date End Date Taking? Authorizing Provider  levonorgestrel (MIRENA) 20 MCG/24HR IUD 1 each by Intrauterine route once.    [provider]      Allergies    Amoxicillin    Review of Systems   Review of  Systems  Respiratory:  Positive for shortness of breath.   Cardiovascular:  Positive for chest pain.  All other systems reviewed and are negative.  Physical Exam Updated Vital Signs BP 110/80    Pulse 68    Temp 98.1 F (36.7 C) (Oral) Comment: Simultaneous filing. User may not have seen previous data. Comment (Src): Simultaneous filing. User may not have seen previous data.   Resp 15    Ht 5' 3.5" (1.613 m)    Wt 62.6 kg    SpO2 98%    BMI 24.06 kg/m  Physical Exam Vitals and nursing note reviewed.  Constitutional:      General: She is not in acute distress.    Appearance: Normal appearance.     Comments: Somewhat anxious  HENT:     Head: Normocephalic and atraumatic.  Eyes:     General:        Right eye: No discharge.        Left eye: No discharge.  Cardiovascular:     Rate and Rhythm: Normal rate and regular rhythm.     Heart sounds: No murmur heard.   No friction rub. No gallop.     Comments: Patient with some inspiratory splitting, no irregular rhythm noted Pulmonary:     Effort: Pulmonary effort is normal.     Breath sounds: Normal breath sounds.  Abdominal:     General: Bowel sounds are normal.     Palpations: Abdomen is soft.  Skin:  General: Skin is warm and dry.     Capillary Refill: Capillary refill takes less than 2 seconds.  Neurological:     Mental Status: She is alert and oriented to person, place, and time.  Psychiatric:        Mood and Affect: Mood normal.        Behavior: Behavior normal.    ED Results / Procedures / Treatments   Labs (all labs ordered are listed, but only abnormal results are displayed) Labs Reviewed  CBC  PREGNANCY, URINE  BASIC METABOLIC PANEL  TROPONIN I (HIGH SENSITIVITY)    EKG EKG Interpretation  Date/Time:  Sunday May 10 2021 20:39:53 EST Ventricular Rate:  82 PR Interval:  120 QRS Duration: 106 QT Interval:  399 QTC Calculation: 466 R Axis:   42 Text Interpretation: Sinus rhythm Nonspecific T wave  abnormality Confirmed by Cathren Laine ([id number]) on 05/10/2021 9:03:30 PM  Radiology DG Chest 2 View  Result Date: 05/10/2021 CLINICAL DATA:  Chest pain.  Shortness of breath.  Tachycardia. EXAM: CHEST - 2 VIEW COMPARISON:  01/26/2013 FINDINGS: Artifact overlies the chest. Heart size is normal. Mediastinal shadows are normal. Question bronchitis pattern. No consolidation, collapse or effusion. No acute bone finding. IMPRESSION: Possible bronchitis.  No consolidation or collapse. Electronically Signed   By: Paulina Fusi M.D.   On: 05/10/2021 21:28    Procedures Procedures    Medications Ordered in ED Medications - No data to display  ED Course/ Medical Decision Making/ A&P                           Medical Decision Making Amount and/or Complexity of Data Reviewed Labs: ordered. Radiology: ordered.   Given the large differential diagnosis for Megan Dickerson, the decision making in this case is of high complexity.  This patient with no significant past medical history comes in for concern of chest pain, heart palpitations, feeling of shortness of breath.  Differential diagnosis on emergent basis includes ACS, acute dissection, aortic aneurysm, pneumonia, Boerhaave's, Mallory-Weiss tear, pulmonary embolism, acute bronchitis, acid reflux, anxiety.  This is not an exhaustive differential.  The patient is in with feeling of fluttery chest, possible heart palpitations, some warmth in the chest, as well as some pressure, some tingling into the left arm.  She is endorsed some left arm tingling in the past but denies history of similar chest pain.  Patient denies any exertional component to her chest pain.  I independently interpreted her EKG which shows normal sinus rhythm with nonspecific T wave abnormalities.  My attending physician Dr. Denton Lank intermittently reviewed and agrees with this interpretation.  Independently ordered and interpreted radiographic imaging of the chest which shows no acute  intrathoracic abnormality.  Radiologist interpretation questions of bronchitis pattern, patient is not having any respiratory symptoms including cough, production of sputum.  In this clinical setting I doubt interpretation of bronchitis.  Independently ordered and reviewed lab work including CBC, BMP, troponin, urine pregnancy test which shows normal white blood cell count, normal hemoglobin, normal platelets.  Negative pregnancy test.  BMP and troponin are in process at time of handoff.  10:00 PM Care of Megan Dickerson transferred to Dr. Denton Lank at the end of my shift as the patient will require reassessment once labs/imaging have resulted. Patient presentation, ED course, and plan of care discussed with review of all pertinent labs and imaging. Please see his/her note for further details regarding further ED course and disposition.  Plan at time of handoff is pending results of troponin, likely d/c home with PCP follow up. This may be altered or completely changed at the discretion of the oncoming team pending results of further workup.   Final Clinical Impression(s) / ED Diagnoses Final diagnoses:  Chest pain, unspecified type    Rx / DC Orders ED Discharge Orders     None         [place] 05/10/21 2200    Cathren Laine, MD 05/12/21 1537

## 2021-05-10 NOTE — Discharge Instructions (Addendum)
Your work-up today was reassuring, your heart rate, rhythm, and blood pressure are normal.  I do not see any evidence of arrhythmia, heart attack, or other acute abnormality.  You continue to have sensation of heart palpitations I recommend that you follow-up with a cardiologist for potential discussion of being placed on an event monitor.  If GI/reflux symptoms, you may try pepcid and maalox as need for symptom relief.   Follow up with primary care doctor in the coming week.  Return to ER if worse, new symptoms, recurrent or persistent chest pain, increased trouble breathing, persistent fast heart beating or other concern.

## 2021-05-12 ENCOUNTER — Other Ambulatory Visit: Payer: Self-pay

## 2021-05-12 ENCOUNTER — Encounter (HOSPITAL_BASED_OUTPATIENT_CLINIC_OR_DEPARTMENT_OTHER): Payer: Self-pay | Admitting: Cardiology

## 2021-05-12 ENCOUNTER — Ambulatory Visit (INDEPENDENT_AMBULATORY_CARE_PROVIDER_SITE_OTHER): Payer: BC Managed Care – PPO | Admitting: Cardiology

## 2021-05-12 VITALS — BP 100/64 | HR 64 | Ht 63.0 in | Wt 160.0 lb

## 2021-05-12 DIAGNOSIS — R0789 Other chest pain: Secondary | ICD-10-CM

## 2021-05-12 DIAGNOSIS — Z7189 Other specified counseling: Secondary | ICD-10-CM | POA: Diagnosis not present

## 2021-05-12 DIAGNOSIS — R030 Elevated blood-pressure reading, without diagnosis of hypertension: Secondary | ICD-10-CM | POA: Diagnosis not present

## 2021-05-12 NOTE — Progress Notes (Signed)
Cardiology Office Note:    Date:  05/12/2021   ID:  Megan Dickerson, DOB [birth year], MRN [id number]  PCP:  Pcp, No  Cardiologist:  Buford Dresser, MD  Referring MD: Lajean Saver, MD   CC: new patient consultation for chest pain, isolated hypertension  History of Present Illness:    Megan Dickerson is a 41 y.o. female with a hx of pregnancy-induced hypertension who is seen as a new consult at the request of Lajean Saver, MD for the evaluation and management of hypertension and chest pain.  She presented to the ED 05/10/21 for chest pain with concern for high blood pressure and elevated heart rate. She was sitting in church when she suddenly started feeling lightheaded. Her blood pressure that day peaked at 148/90 and her pulse ranged from 80s to 90s bpm. She had some chest tingling and 1 to 2 episodes of chest pain with the latest episode occurring in the car ride to the ED. She also reported L arm tingling. She was recommended to follow-up with cardiology.  Today: She is accompanied by her husband. She endorses a history of low blood pressure that can cause her to feel faint. The only times she had high blood pressure was occasionally during pregnancy.  Sunday morning, she was at church and she had a sensation of warmth and lightness in her chest. Her heart rate was in the 80s while sitting. When she stood up, her rate suddenly elevated to 90s which caused her to sit back down and rest. When she came home, her father-in-law's girlfriend, who is a Marine scientist, listened to her heart and told her it sounded irregular. The chest sensation persisted throughout the day with associated L arm tingling which caused her to present to the ER. She was told that her tests came back normal.   She has never smoked and does not drink alcohol. Her father has a pacemaker and her mother has a history of palpitations. Her grandmother had congestive heart failure. For exercise, she walks around the house. She has 5  sons and homeschools her younger children. For diet, she endorses a balanced diet with meats and vegetables. She does have carbohydrates because of her children.   Denies shortness of breath at rest or with normal exertion. No PND, orthopnea, LE edema or unexpected weight gain. No syncope or palpitations.  Past Medical History:  Diagnosis Date   Abnormal Pap smear    Complication of anesthesia 2008   Patient said she had  hard time coming out of anesthesia   Dysuria 11/2006   GBS carrier    In all 4 pregnancy   H/O candidiasis    H/O dyspareunia 2004   History of hemorrhoids 2005   Irregular bleeding 04/2005   Postpartum breast / nipple pain 01/20/08   Pregnancy induced hypertension     Past Surgical History:  Procedure Laterality Date   CYSTOSCOPY  age 40   NO PAST SURGERIES      Current Medications: Current Outpatient Medications on File Prior to Visit  Medication Sig   levonorgestrel (MIRENA) 20 MCG/24HR IUD 1 each by Intrauterine route once.   No current facility-administered medications on file prior to visit.     Allergies:   Amoxicillin   Social History   Tobacco Use   Smoking status: Never   Smokeless tobacco: Never  Substance Use Topics   Alcohol use: No   Drug use: No    Family History: family history includes Alcohol abuse in her  maternal uncle and paternal uncle; COPD in her maternal grandfather; Cancer in her paternal grandmother; Diabetes in her maternal grandmother; Heart disease in her maternal grandfather and maternal grandmother; Hypertension in her father; Thyroid disease in her father.  ROS:   Please see the history of present illness.  Additional pertinent ROS: Constitutional: Negative for chills, fever, night sweats, unintentional weight loss  HENT: Negative for ear pain and hearing loss.   Eyes: Negative for loss of vision and eye pain.  Respiratory: Negative for cough, sputum, wheezing.   Cardiovascular: See HPI. Positive for chest  pressure Gastrointestinal: Negative for abdominal pain, melena, and hematochezia.  Genitourinary: Negative for dysuria and hematuria.  Musculoskeletal: Negative for falls and myalgias.  Skin: Negative for itching and rash.  Neurological: Negative for focal weakness, focal sensory changes and loss of consciousness.  Endo/Heme/Allergies: Does not bruise/bleed easily.    EKGs/Labs/Other Studies Reviewed:    The following studies were reviewed today: Chest Xray 05/10/21  FINDINGS: Artifact overlies the chest. Heart size is normal. Mediastinal shadows are normal. Question bronchitis pattern. No consolidation, collapse or effusion. No acute bone finding.   IMPRESSION: Possible bronchitis.  No consolidation or collapse.  EKG:  EKG is personally reviewed.   05/12/21: NSR at 64 bpm  Recent Labs: 05/10/2021: BUN 11; Creatinine, Ser 0.80; Hemoglobin 13.7; Platelets 240; Potassium 3.5; Sodium 140  Recent Lipid Panel No results found for: CHOL, TRIG, HDL, CHOLHDL, VLDL, LDLCALC, LDLDIRECT  Physical Exam:    VS:  BP 100/64    Pulse 64    Ht 5\' 3"  (1.6 m)    Wt 160 lb (72.6 kg)    SpO2 98%    BMI 28.34 kg/m     Wt Readings from Last 3 Encounters:  05/12/21 160 lb (72.6 kg)  05/10/21 138 lb 0.1 oz (62.6 kg)  01/26/13 138 lb (62.6 kg)    GEN: Well nourished, well developed in no acute distress HEENT: Normal, moist mucous membranes NECK: No JVD CARDIAC: regular rhythm, normal S1 and S2, no rubs or gallops. No murmur. VASCULAR: Radial and DP pulses 2+ bilaterally. No carotid bruits RESPIRATORY:  Clear to auscultation without rales, wheezing or rhonchi  ABDOMEN: Soft, non-tender, non-distended MUSCULOSKELETAL:  Ambulates independently SKIN: Warm and dry, no edema NEUROLOGIC:  Alert and oriented x 3. No focal neuro deficits noted. PSYCHIATRIC:  Normal affect    ASSESSMENT:    1. Atypical chest pain   2. Elevated blood pressure reading   3. Cardiac risk counseling   4. Counseling on  health promotion and disease prevention    PLAN:    Atypical chest pain: -We spent significant time today reviewing different parts of the cardiovascular system (electrical, vascular, functional, and valvular). We discussed how each of these systems can present with different symptoms. We reviewed that there are different ways we evaluate these symptoms with tests. We reviewed which tests I think are most appropriate given the symptoms, and we discussed risks/benefits and limitations of each of these tests.  -after shared decision making, will hold on further testing at this time given overall low risk and single episode of symptoms. If symptoms recur, she will contact me. We discussed CT as the next option for testing. Issue would be potential blood pressure drop with nitroglycerin, she would likely require IV fluids to manage this during the procedure -discussed red flag warning signs that need immediate medical attention  Isolated elevated blood pressure -isolated elevated when feeling poorly, typically runs low -no indication for intervention  Cardiac risk counseling and prevention recommendations: -recommend heart healthy/Mediterranean diet, with whole grains, fruits, vegetable, fish, lean meats, nuts, and olive oil. Limit salt. -recommend moderate walking, 3-5 times/week for 30-50 minutes each session. Aim for at least 150 minutes.week. Goal should be pace of 3 miles/hours, or walking 1.5 miles in 30 minutes -recommend avoidance of tobacco products. Avoid excess alcohol. -ASCVD risk score: The ASCVD Risk score (Arnett DK, et al., 2019) failed to calculate for the following reasons:   Cannot find a previous HDL lab   Cannot find a previous total cholesterol lab    Plan for follow up: PRN  Buford Dresser, MD, PhD, [facility]    Medication Adjustments/Labs and Tests Ordered: Current medicines are reviewed at length with the patient today.  Concerns  regarding medicines are outlined above.  Orders Placed This Encounter  Procedures   Thyroid Panel With TSH   Lipid Profile   EKG 12-Lead   No orders of the defined types were placed in this encounter.   Patient Instructions  Medication Instructions:  Your physician recommends that you continue on your current medications as directed. Please refer to the Current Medication list given to you today.   Labwork: FASTING THYROID/LIPID PANEL SOON   Testing/Procedures: NONE   Follow-Up: AS NEEDED    I,Mykaella Javier,acting as a scribe for [employer], MD.,have documented all relevant documentation on the behalf of Buford Dresser, MD,as directed by  Buford Dresser, MD while in the presence of Buford Dresser, MD.  I, Buford Dresser, MD, have reviewed all documentation for this visit. The documentation on 05/12/21 for the exam, diagnosis, procedures, and orders are all accurate and complete.   Signed, Buford Dresser, MD PhD 05/12/2021 6:00 PM    [place]

## 2021-05-12 NOTE — Patient Instructions (Addendum)
Medication Instructions:  Your physician recommends that you continue on your current medications as directed. Please refer to the Current Medication list given to you today.   Labwork: FASTING THYROID/LIPID PANEL SOON   Testing/Procedures: NONE   Follow-Up: AS NEEDED

## 2021-05-15 DIAGNOSIS — R0789 Other chest pain: Secondary | ICD-10-CM | POA: Diagnosis not present

## 2021-05-15 LAB — THYROID PANEL WITH TSH
Free Thyroxine Index: 1.7 (ref 1.2–4.9)
T3 Uptake Ratio: 27 % (ref 24–39)
T4, Total: 6.3 ug/dL (ref 4.5–12.0)
TSH: 1.76 u[IU]/mL (ref 0.450–4.500)

## 2021-05-15 LAB — LIPID PANEL
Chol/HDL Ratio: 2.7 ratio (ref 0.0–4.4)
Cholesterol, Total: 162 mg/dL (ref 100–199)
HDL: 60 mg/dL (ref >39)
LDL Chol Calc (NIH): 91 mg/dL (ref 0–99)
Triglycerides: 56 mg/dL (ref 0–149)
VLDL Cholesterol Cal: 11 mg/dL (ref 5–40)

## 2021-07-02 ENCOUNTER — Encounter (HOSPITAL_BASED_OUTPATIENT_CLINIC_OR_DEPARTMENT_OTHER): Payer: Self-pay

## 2021-07-02 NOTE — Telephone Encounter (Signed)
Dr. Cristal Deer Patient, please advised  ?

## 2021-09-13 DIAGNOSIS — H524 Presbyopia: Secondary | ICD-10-CM | POA: Diagnosis not present

## 2021-10-07 DIAGNOSIS — D225 Melanocytic nevi of trunk: Secondary | ICD-10-CM | POA: Diagnosis not present

## 2021-10-07 DIAGNOSIS — Z85820 Personal history of malignant melanoma of skin: Secondary | ICD-10-CM | POA: Diagnosis not present

## 2021-10-07 DIAGNOSIS — Z08 Encounter for follow-up examination after completed treatment for malignant neoplasm: Secondary | ICD-10-CM | POA: Diagnosis not present

## 2021-10-07 DIAGNOSIS — Z1283 Encounter for screening for malignant neoplasm of skin: Secondary | ICD-10-CM | POA: Diagnosis not present

## 2022-04-07 DIAGNOSIS — D225 Melanocytic nevi of trunk: Secondary | ICD-10-CM | POA: Diagnosis not present

## 2022-04-07 DIAGNOSIS — L905 Scar conditions and fibrosis of skin: Secondary | ICD-10-CM | POA: Diagnosis not present

## 2022-04-07 DIAGNOSIS — Z1283 Encounter for screening for malignant neoplasm of skin: Secondary | ICD-10-CM | POA: Diagnosis not present

## 2022-04-07 DIAGNOSIS — Z85820 Personal history of malignant melanoma of skin: Secondary | ICD-10-CM | POA: Diagnosis not present

## 2022-04-07 DIAGNOSIS — Z08 Encounter for follow-up examination after completed treatment for malignant neoplasm: Secondary | ICD-10-CM | POA: Diagnosis not present

## 2022-06-08 DIAGNOSIS — Z1231 Encounter for screening mammogram for malignant neoplasm of breast: Secondary | ICD-10-CM | POA: Diagnosis not present

## 2022-06-15 DIAGNOSIS — N6002 Solitary cyst of left breast: Secondary | ICD-10-CM | POA: Diagnosis not present

## 2022-06-15 DIAGNOSIS — R921 Mammographic calcification found on diagnostic imaging of breast: Secondary | ICD-10-CM | POA: Diagnosis not present

## 2022-06-23 DIAGNOSIS — D0512 Intraductal carcinoma in situ of left breast: Secondary | ICD-10-CM | POA: Diagnosis not present

## 2022-06-25 ENCOUNTER — Telehealth: Payer: Self-pay | Admitting: Hematology and Oncology

## 2022-06-25 ENCOUNTER — Encounter: Payer: Self-pay | Admitting: Obstetrics and Gynecology

## 2022-06-25 NOTE — Telephone Encounter (Signed)
Spoke to patient to confirm upcoming afternoon [facility] clinic appointment on 4/10, paperwork will be sent via [place].   Gave location and time, also informed patient that the surgeon's office would be calling as well to get information from them similar to the packet that they will be receiving so make sure to do both.  Reminded patient that all providers will be coming to the clinic to see them HERE and if they had any questions to not hesitate to reach back out to myself or their navigators.

## 2022-07-13 ENCOUNTER — Ambulatory Visit: Payer: Self-pay | Admitting: Surgery

## 2022-07-13 DIAGNOSIS — N6092 Unspecified benign mammary dysplasia of left breast: Secondary | ICD-10-CM | POA: Diagnosis not present

## 2022-07-13 NOTE — H&P (View-Only) (Signed)
Subjective   Chief Complaint: New Consultation (left breast ADH)   [facility]  History of Present Illness: Megan Dickerson is a 42 y.o. female who is seen today as an office consultation at the request of Dr. Nedra Hai for evaluation of New Consultation (left breast ADH) .    This is a 42 year old female in good health with no family history of breast cancer who presents after recent mammogram.  This was her first screening mammogram.  She was found to have some calcifications in the left breast as well as a mass.  She underwent further workup that showed a 4 cm x 1.7 x 1.5 cm area of calcifications in the medial left breast from 8 30-9 30 middle depth.  She also has a 1.2 x 0.5 x 0.9 cm area located at 9:00 3 cm from the nipple.  She also has a cyst in the medial left breast measuring 1.3 cm.  She underwent biopsy of the mass located at 9:00 3 cm from the nipple.  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  However further review of pathology adjusted the diagnosis to atypical ductal hyperplasia.  The wider area of calcifications was not biopsied.  She is accompanied by her husband today.  The patient has no first-degree relatives.  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  No previous breast problems or breast surgeries.  She did breast-feed.   Review of Systems: A complete review of systems was obtained from the patient.  I have reviewed this information and discussed as appropriate with the patient.  See HPI as well for other ROS.  Review of Systems  Constitutional: Negative.   HENT: Negative.    Eyes: Negative.   Respiratory: Negative.    Cardiovascular: Negative.   Gastrointestinal: Negative.   Genitourinary: Negative.   Musculoskeletal: Negative.   Skin: Negative.   Neurological: Negative.   Endo/Heme/Allergies: Negative.   Psychiatric/Behavioral: Negative.        Medical History: Past Medical History:  Diagnosis Date   Arthritis    History of cancer      Patient Active Problem List  Diagnosis   History of malignant melanoma   Atypical ductal hyperplasia of left breast    Past Surgical History:  Procedure Laterality Date   MELANOMA REMOVED  2021     Allergies  Allergen Reactions   Amoxicillin Other (See Comments)    Current Outpatient Medications on File Prior to Visit  Medication Sig Dispense Refill   fluticasone propionate (FLONASE) 50 mcg/actuation nasal spray Place 2 sprays into both nostrils once daily     No current facility-administered medications on file prior to visit.    History reviewed. No pertinent family history.   Social History   Tobacco Use  Smoking Status Never  Smokeless Tobacco Never     Social History   Socioeconomic History   Marital status: Married  Tobacco Use   Smoking status: Never   Smokeless tobacco: Never  Substance and Sexual Activity   Alcohol use: Not Currently   Drug use: Never    Objective:    Vitals:   07/13/22 1602 07/13/22 1606  BP: 114/78   Pulse: 73   Temp: 36.9 C (98.4 F)   SpO2: 98%   Weight: 59.4 kg (131 lb)   Height: 160 cm ( )   PainSc:  0-No pain  PainLoc:  Breast    Body mass index is 23.21 kg/m.  Physical Exam   Constitutional:  WDWN in NAD,  conversant, no obvious deformities; lying in bed comfortably Eyes:  Pupils equal, round; sclera anicteric; moist conjunctiva; no lid lag HENT:  Oral mucosa moist; good dentition  Neck:  No masses palpated, trachea midline; no thyromegaly Lungs:  CTA bilaterally; normal respiratory effort Breasts:  symmetric, no nipple changes; bilateral ptosis; bilateral fibrocystic changes. Right breast shows no dominant masses or lymphadenopathy Left breast shows no dominant masses or lymphadenopathy Abd:  +bowel sounds, soft, non-tender, no palpable organomegaly; no palpable hernias Musc: Normal gait; no apparent clubbing or cyanosis in extremities Lymphatic:  No palpable cervical or axillary  lymphadenopathy Skin:  Warm, dry; no sign of jaundice Psychiatric - alert and oriented x 4; calm mood and affect   Labs, Imaging and Diagnostic Testing: See HPI  Assessment and Plan:  Diagnoses and all orders for this visit:  Atypical ductal hyperplasia of left breast  Calcifications medial left breast  I had a long discussion with the patient and her husband regarding the pathology results and the mammographic findings.  Recommend left breast radioactive seed localized lumpectomy of the area of atypical ductal hyperplasia.  This will include some surrounding breast tissue that contains the calcifications to establish a definitive diagnosis of this area.  We did discuss the possibility of ductal carcinoma in situ and what the expected treatment course would be.  Complete excision of the entire area of DCIS is likely not a good option considering the size of her breast and the wide area that would be required for excision.  We also discussed the possibility of genetic testing, should her lumpectomy pathology come back positive for cancer.  We discussed the possibility of mastectomy or bilateral mastectomies with or without reconstruction.  We will proceed with scheduling the left breast lumpectomy urgently.  The surgical procedure has been discussed with the patient.  Potential risks, benefits, alternative treatments, and expected outcomes have been explained.  All of the patient's questions at this time have been answered.  The likelihood of reaching the patient's treatment goal is good.  The patient understand the proposed surgical procedure and wishes to proceed.    Nahuel Wilbert Delbert Harness, MD  07/13/2022 5:02 PM

## 2022-07-13 NOTE — H&P (Signed)
Subjective   Chief Complaint: New Consultation (left breast ADH)   [facility]  History of Present Illness: Megan Dickerson is a 42 y.o. female who is seen today as an office consultation at the request of Dr. Nedra Hai for evaluation of New Consultation (left breast ADH) .    This is a 42 year old female in good health with no family history of breast cancer who presents after recent mammogram.  This was her first screening mammogram.  She was found to have some calcifications in the left breast as well as a mass.  She underwent further workup that showed a 4 cm x 1.7 x 1.5 cm area of calcifications in the medial left breast from 8 30-9 30 middle depth.  She also has a 1.2 x 0.5 x 0.9 cm area located at 9:00 3 cm from the nipple.  She also has a cyst in the medial left breast measuring 1.3 cm.  She underwent biopsy of the mass located at 9:00 3 cm from the nipple.  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  However further review of pathology adjusted the diagnosis to atypical ductal hyperplasia.  The wider area of calcifications was not biopsied.  She is accompanied by her husband today.  The patient has no first-degree relatives.  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  No previous breast problems or breast surgeries.  She did breast-feed.   Review of Systems: A complete review of systems was obtained from the patient.  I have reviewed this information and discussed as appropriate with the patient.  See HPI as well for other ROS.  Review of Systems  Constitutional: Negative.   HENT: Negative.    Eyes: Negative.   Respiratory: Negative.    Cardiovascular: Negative.   Gastrointestinal: Negative.   Genitourinary: Negative.   Musculoskeletal: Negative.   Skin: Negative.   Neurological: Negative.   Endo/Heme/Allergies: Negative.   Psychiatric/Behavioral: Negative.        Medical History: Past Medical History:  Diagnosis Date   Arthritis    History of cancer      Patient Active Problem List  Diagnosis   History of malignant melanoma   Atypical ductal hyperplasia of left breast    Past Surgical History:  Procedure Laterality Date   MELANOMA REMOVED  2021     Allergies  Allergen Reactions   Amoxicillin Other (See Comments)    Current Outpatient Medications on File Prior to Visit  Medication Sig Dispense Refill   fluticasone propionate (FLONASE) 50 mcg/actuation nasal spray Place 2 sprays into both nostrils once daily     No current facility-administered medications on file prior to visit.    History reviewed. No pertinent family history.   Social History   Tobacco Use  Smoking Status Never  Smokeless Tobacco Never     Social History   Socioeconomic History   Marital status: Married  Tobacco Use   Smoking status: Never   Smokeless tobacco: Never  Substance and Sexual Activity   Alcohol use: Not Currently   Drug use: Never    Objective:    Vitals:   07/13/22 1602 07/13/22 1606  BP: 114/78   Pulse: 73   Temp: 36.9 C (98.4 F)   SpO2: 98%   Weight: 59.4 kg (131 lb)   Height: 160 cm ( )   PainSc:  0-No pain  PainLoc:  Breast    Body mass index is 23.21 kg/m.  Physical Exam   Constitutional:  WDWN in NAD,  conversant, no obvious deformities; lying in bed comfortably Eyes:  Pupils equal, round; sclera anicteric; moist conjunctiva; no lid lag HENT:  Oral mucosa moist; good dentition  Neck:  No masses palpated, trachea midline; no thyromegaly Lungs:  CTA bilaterally; normal respiratory effort Breasts:  symmetric, no nipple changes; bilateral ptosis; bilateral fibrocystic changes. Right breast shows no dominant masses or lymphadenopathy Left breast shows no dominant masses or lymphadenopathy Abd:  +bowel sounds, soft, non-tender, no palpable organomegaly; no palpable hernias Musc: Normal gait; no apparent clubbing or cyanosis in extremities Lymphatic:  No palpable cervical or axillary  lymphadenopathy Skin:  Warm, dry; no sign of jaundice Psychiatric - alert and oriented x 4; calm mood and affect   Labs, Imaging and Diagnostic Testing: See HPI  Assessment and Plan:  Diagnoses and all orders for this visit:  Atypical ductal hyperplasia of left breast  Calcifications medial left breast  I had a long discussion with the patient and her husband regarding the pathology results and the mammographic findings.  Recommend left breast radioactive seed localized lumpectomy of the area of atypical ductal hyperplasia.  This will include some surrounding breast tissue that contains the calcifications to establish a definitive diagnosis of this area.  We did discuss the possibility of ductal carcinoma in situ and what the expected treatment course would be.  Complete excision of the entire area of DCIS is likely not a good option considering the size of her breast and the wide area that would be required for excision.  We also discussed the possibility of genetic testing, should her lumpectomy pathology come back positive for cancer.  We discussed the possibility of mastectomy or bilateral mastectomies with or without reconstruction.  We will proceed with scheduling the left breast lumpectomy urgently.  The surgical procedure has been discussed with the patient.  Potential risks, benefits, alternative treatments, and expected outcomes have been explained.  All of the patient's questions at this time have been answered.  The likelihood of reaching the patient's treatment goal is good.  The patient understand the proposed surgical procedure and wishes to proceed.    Elzy Tomasello KAI Kaulin Chaves, MD  07/13/2022 5:02 PM 

## 2022-07-14 ENCOUNTER — Other Ambulatory Visit: Payer: Self-pay

## 2022-07-14 ENCOUNTER — Encounter (HOSPITAL_BASED_OUTPATIENT_CLINIC_OR_DEPARTMENT_OTHER): Payer: Self-pay | Admitting: Surgery

## 2022-07-16 NOTE — Progress Notes (Signed)

## 2022-07-19 DIAGNOSIS — R928 Other abnormal and inconclusive findings on diagnostic imaging of breast: Secondary | ICD-10-CM | POA: Diagnosis not present

## 2022-07-20 ENCOUNTER — Encounter (HOSPITAL_BASED_OUTPATIENT_CLINIC_OR_DEPARTMENT_OTHER)
Admission: RE | Disposition: A | Discharge status: Home / Self Care | Payer: Self-pay | Source: Home / Self Care | Attending: Surgery

## 2022-07-20 ENCOUNTER — Ambulatory Visit (HOSPITAL_BASED_OUTPATIENT_CLINIC_OR_DEPARTMENT_OTHER): Payer: BC Managed Care – PPO | Admitting: Anesthesiology

## 2022-07-20 ENCOUNTER — Other Ambulatory Visit: Payer: Self-pay

## 2022-07-20 ENCOUNTER — Encounter (HOSPITAL_BASED_OUTPATIENT_CLINIC_OR_DEPARTMENT_OTHER): Payer: Self-pay | Admitting: Surgery

## 2022-07-20 ENCOUNTER — Ambulatory Visit (HOSPITAL_BASED_OUTPATIENT_CLINIC_OR_DEPARTMENT_OTHER)
Admission: RE | Admit: 2022-07-20 | Discharge: 2022-07-20 | Disposition: A | Discharge status: Home / Self Care | Payer: BC Managed Care – PPO | Attending: Surgery | Admitting: Surgery

## 2022-07-20 DIAGNOSIS — N6489 Other specified disorders of breast: Secondary | ICD-10-CM | POA: Insufficient documentation

## 2022-07-20 DIAGNOSIS — N6092 Unspecified benign mammary dysplasia of left breast: Secondary | ICD-10-CM | POA: Diagnosis not present

## 2022-07-20 DIAGNOSIS — N6022 Fibroadenosis of left breast: Secondary | ICD-10-CM | POA: Insufficient documentation

## 2022-07-20 DIAGNOSIS — R928 Other abnormal and inconclusive findings on diagnostic imaging of breast: Secondary | ICD-10-CM | POA: Diagnosis not present

## 2022-07-20 DIAGNOSIS — N6012 Diffuse cystic mastopathy of left breast: Secondary | ICD-10-CM | POA: Diagnosis not present

## 2022-07-20 DIAGNOSIS — D242 Benign neoplasm of left breast: Secondary | ICD-10-CM | POA: Diagnosis not present

## 2022-07-20 DIAGNOSIS — N6032 Fibrosclerosis of left breast: Secondary | ICD-10-CM | POA: Insufficient documentation

## 2022-07-20 DIAGNOSIS — Z01818 Encounter for other preprocedural examination: Secondary | ICD-10-CM

## 2022-07-20 DIAGNOSIS — N6002 Solitary cyst of left breast: Secondary | ICD-10-CM | POA: Diagnosis not present

## 2022-07-20 HISTORY — PX: BREAST LUMPECTOMY WITH RADIOACTIVE SEED LOCALIZATION: SHX6424

## 2022-07-20 LAB — POCT PREGNANCY, URINE: Preg Test, Ur: NEGATIVE

## 2022-07-20 SURGERY — BREAST LUMPECTOMY WITH RADIOACTIVE SEED LOCALIZATION
Anesthesia: General | Site: Breast | Laterality: Left

## 2022-07-20 MED ORDER — OXYCODONE HCL 5 MG/5ML PO SOLN: 5.0000 mg | Freq: Once | ORAL | Status: DC | PRN

## 2022-07-20 MED ORDER — DEXAMETHASONE SODIUM PHOSPHATE 10 MG/ML IJ SOLN
INTRAMUSCULAR | Status: AC
  Filled 2022-07-20: qty 1

## 2022-07-20 MED ORDER — ACETAMINOPHEN 500 MG PO TABS
1000.0000 mg | ORAL_TABLET | ORAL | Status: AC
  Administered 2022-07-20: 1000 mg via ORAL

## 2022-07-20 MED ORDER — ONDANSETRON HCL 4 MG/2ML IJ SOLN: 4.0000 mg | Freq: Once | INTRAMUSCULAR | Status: DC | PRN

## 2022-07-20 MED ORDER — PROPOFOL 10 MG/ML IV BOLUS
INTRAVENOUS | Status: DC | PRN
  Administered 2022-07-20: 120 mg via INTRAVENOUS

## 2022-07-20 MED ORDER — PROPOFOL 10 MG/ML IV BOLUS
INTRAVENOUS | Status: AC
  Filled 2022-07-20: qty 20

## 2022-07-20 MED ORDER — CHLORHEXIDINE GLUCONATE CLOTH 2 % EX PADS: 6.0000 | MEDICATED_PAD | Freq: Once | CUTANEOUS | Status: DC

## 2022-07-20 MED ORDER — FENTANYL CITRATE (PF) 100 MCG/2ML IJ SOLN: 25.0000 ug | INTRAMUSCULAR | Status: DC | PRN

## 2022-07-20 MED ORDER — CEFAZOLIN SODIUM-DEXTROSE 2-4 GM/100ML-% IV SOLN
2.0000 g | INTRAVENOUS | Status: AC
  Administered 2022-07-20: 2 g via INTRAVENOUS

## 2022-07-20 MED ORDER — LIDOCAINE HCL (CARDIAC) PF 100 MG/5ML IV SOSY
PREFILLED_SYRINGE | INTRAVENOUS | Status: DC | PRN
  Administered 2022-07-20: 60 mg via INTRAVENOUS

## 2022-07-20 MED ORDER — CEFAZOLIN SODIUM-DEXTROSE 2-3 GM-%(50ML) IV SOLR
INTRAVENOUS | Status: DC | PRN
  Administered 2022-07-20: 2 g via INTRAVENOUS

## 2022-07-20 MED ORDER — LIDOCAINE 2% (20 MG/ML) 5 ML SYRINGE
INTRAMUSCULAR | Status: AC
  Filled 2022-07-20: qty 5

## 2022-07-20 MED ORDER — CEFAZOLIN SODIUM-DEXTROSE 2-4 GM/100ML-% IV SOLN
INTRAVENOUS | Status: AC
  Filled 2022-07-20: qty 100

## 2022-07-20 MED ORDER — ONDANSETRON HCL 4 MG/2ML IJ SOLN
INTRAMUSCULAR | Status: DC | PRN
  Administered 2022-07-20: 4 mg via INTRAVENOUS

## 2022-07-20 MED ORDER — ONDANSETRON HCL 4 MG/2ML IJ SOLN
INTRAMUSCULAR | Status: AC
  Filled 2022-07-20: qty 2

## 2022-07-20 MED ORDER — FENTANYL CITRATE (PF) 100 MCG/2ML IJ SOLN
INTRAMUSCULAR | Status: AC
  Filled 2022-07-20: qty 2

## 2022-07-20 MED ORDER — ACETAMINOPHEN 500 MG PO TABS
ORAL_TABLET | ORAL | Status: AC
  Filled 2022-07-20: qty 2

## 2022-07-20 MED ORDER — LACTATED RINGERS IV SOLN: INTRAVENOUS | Status: DC

## 2022-07-20 MED ORDER — BUPIVACAINE-EPINEPHRINE (PF) 0.25% -1:200000 IJ SOLN
INTRAMUSCULAR | Status: DC | PRN
  Administered 2022-07-20: 10 mL

## 2022-07-20 MED ORDER — FENTANYL CITRATE (PF) 100 MCG/2ML IJ SOLN
INTRAMUSCULAR | Status: DC | PRN
  Administered 2022-07-20: 100 ug via INTRAVENOUS

## 2022-07-20 MED ORDER — MIDAZOLAM HCL 2 MG/2ML IJ SOLN
INTRAMUSCULAR | Status: DC | PRN
  Administered 2022-07-20: 2 mg via INTRAVENOUS

## 2022-07-20 MED ORDER — MIDAZOLAM HCL 2 MG/2ML IJ SOLN
INTRAMUSCULAR | Status: AC
  Filled 2022-07-20: qty 2

## 2022-07-20 MED ORDER — OXYCODONE HCL 5 MG PO TABS: 5.0000 mg | ORAL_TABLET | Freq: Once | ORAL | Status: DC | PRN

## 2022-07-20 MED ORDER — PROPOFOL 500 MG/50ML IV EMUL
INTRAVENOUS | Status: DC | PRN
  Administered 2022-07-20: 35 ug/kg/min via INTRAVENOUS

## 2022-07-20 MED ORDER — DEXAMETHASONE SODIUM PHOSPHATE 10 MG/ML IJ SOLN
INTRAMUSCULAR | Status: DC | PRN
  Administered 2022-07-20: 5 mg via INTRAVENOUS

## 2022-07-20 MED ORDER — LACTATED RINGERS IV SOLN: INTRAVENOUS | Status: DC | PRN

## 2022-07-20 SURGICAL SUPPLY — 49 items
APL PRP STRL LF DISP 70% ISPRP (MISCELLANEOUS) ×1
APL SKNCLS STERI-STRIP NONHPOA (GAUZE/BANDAGES/DRESSINGS) ×1
APPLIER CLIP 9.375 MED OPEN (MISCELLANEOUS) ×1
APR CLP MED 9.3 20 MLT OPN (MISCELLANEOUS) ×1
BENZOIN TINCTURE PRP APPL 2/3 (GAUZE/BANDAGES/DRESSINGS) ×1 IMPLANT
BLADE HEX COATED 2.75 (ELECTRODE) ×1 IMPLANT
BLADE SURG 15 STRL LF DISP TIS (BLADE) ×1 IMPLANT
BLADE SURG 15 STRL SS (BLADE) ×1
CANISTER SUC SOCK COL 7IN (MISCELLANEOUS) IMPLANT
CANISTER SUCT 1200ML W/VALVE (MISCELLANEOUS) IMPLANT
CHLORAPREP W/TINT 26 (MISCELLANEOUS) ×1 IMPLANT
CLIP APPLIE 9.375 MED OPEN (MISCELLANEOUS) ×1 IMPLANT
COVER BACK TABLE 60X90IN (DRAPES) ×1 IMPLANT
COVER MAYO STAND STRL (DRAPES) ×1 IMPLANT
COVER PROBE CYLINDRICAL 5X96 (MISCELLANEOUS) ×1 IMPLANT
DRAPE LAPAROTOMY 100X72 PEDS (DRAPES) ×1 IMPLANT
DRAPE UTILITY XL STRL (DRAPES) ×1 IMPLANT
DRSG TEGADERM 4X4.75 (GAUZE/BANDAGES/DRESSINGS) ×1 IMPLANT
ELECT REM PT RETURN 9FT ADLT (ELECTROSURGICAL) ×1
ELECTRODE REM PT RTRN 9FT ADLT (ELECTROSURGICAL) ×1 IMPLANT
GAUZE SPONGE 2X2 STRL 8-PLY (GAUZE/BANDAGES/DRESSINGS) IMPLANT
GAUZE SPONGE 4X4 12PLY STRL LF (GAUZE/BANDAGES/DRESSINGS) IMPLANT
GLOVE BIO SURGEON STRL SZ7 (GLOVE) ×1 IMPLANT
GLOVE BIOGEL PI IND STRL 7.0 (GLOVE) IMPLANT
GLOVE BIOGEL PI IND STRL 7.5 (GLOVE) ×1 IMPLANT
GLOVE ECLIPSE 7.0 STRL STRAW (GLOVE) IMPLANT
GOWN STRL REUS W/ TWL LRG LVL3 (GOWN DISPOSABLE) ×2 IMPLANT
GOWN STRL REUS W/TWL LRG LVL3 (GOWN DISPOSABLE) ×2
KIT MARKER MARGIN INK (KITS) ×1 IMPLANT
NDL HYPO 25X1 1.5 SAFETY (NEEDLE) ×1 IMPLANT
NEEDLE HYPO 25X1 1.5 SAFETY (NEEDLE) ×1 IMPLANT
NS IRRIG 1000ML POUR BTL (IV SOLUTION) ×1 IMPLANT
PACK BASIN DAY SURGERY FS (CUSTOM PROCEDURE TRAY) ×1 IMPLANT
PENCIL SMOKE EVACUATOR (MISCELLANEOUS) ×1 IMPLANT
SLEEVE SCD COMPRESS KNEE MED (STOCKING) ×1 IMPLANT
SPIKE FLUID TRANSFER (MISCELLANEOUS) IMPLANT
SPONGE T-LAP 18X18 ~~LOC~~+RFID (SPONGE) IMPLANT
SPONGE T-LAP 4X18 ~~LOC~~+RFID (SPONGE) ×1 IMPLANT
STRIP CLOSURE SKIN 1/2X4 (GAUZE/BANDAGES/DRESSINGS) ×1 IMPLANT
SUT MON AB 4-0 PC3 18 (SUTURE) ×1 IMPLANT
SUT SILK 2 0 SH (SUTURE) IMPLANT
SUT VIC AB 3-0 SH 27 (SUTURE) ×1
SUT VIC AB 3-0 SH 27X BRD (SUTURE) ×1 IMPLANT
SYR BULB EAR ULCER 3OZ GRN STR (SYRINGE) IMPLANT
SYR CONTROL 10ML LL (SYRINGE) ×1 IMPLANT
TOWEL GREEN STERILE FF (TOWEL DISPOSABLE) ×1 IMPLANT
TRAY FAXITRON CT DISP (TRAY / TRAY PROCEDURE) ×1 IMPLANT
TUBE CONNECTING 20X1/4 (TUBING) IMPLANT
YANKAUER SUCT BULB TIP NO VENT (SUCTIONS) IMPLANT

## 2022-07-20 NOTE — Anesthesia Preprocedure Evaluation (Signed)
Anesthesia Evaluation  Patient identified by MRN, date of birth, ID band Patient awake    Reviewed: Allergy & Precautions, NPO status , Patient's Chart, lab work & pertinent test results  History of Anesthesia Complications (+) PROLONGED EMERGENCE and history of anesthetic complications  Airway Mallampati: I  TM Distance: >3 FB     Dental no notable dental hx. (+) Dental Advisory Given, Teeth Intact   Pulmonary neg pulmonary ROS   Pulmonary exam normal breath sounds clear to auscultation       Cardiovascular negative cardio ROS Normal cardiovascular exam Rhythm:Regular Rate:Normal     Neuro/Psych negative neurological ROS  negative psych ROS   GI/Hepatic negative GI ROS, Neg liver ROS,,,  Endo/Other  Atypical ductal hyperplasia left breast  Renal/GU negative Renal ROS  negative genitourinary   Musculoskeletal negative musculoskeletal ROS (+)    Abdominal   Peds  Hematology negative hematology ROS (+)   Anesthesia Other Findings   Reproductive/Obstetrics negative OB ROS                              Anesthesia Physical Anesthesia Plan  ASA: 1  Anesthesia Plan: General   Post-op Pain Management: Minimal or no pain anticipated   Induction:   PONV Risk Score and Plan: 4 or greater and Treatment may vary due to age or medical condition, Midazolam, Ondansetron and Dexamethasone  Airway Management Planned: LMA  Additional Equipment: None  Intra-op Plan:   Post-operative Plan: Extubation in OR  Informed Consent: I have reviewed the patients History and Physical, chart, labs and discussed the procedure including the risks, benefits and alternatives for the proposed anesthesia with the patient or authorized representative who has indicated his/her understanding and acceptance.     Dental advisory given  Plan Discussed with: Anesthesiologist and CRNA  Anesthesia Plan Comments:           Anesthesia Quick Evaluation

## 2022-07-20 NOTE — Transfer of Care (Signed)
Immediate Anesthesia Transfer of Care Note  Patient: Megan Dickerson  Procedure(s) Performed: LEFT BREAST LUMPECTOMY WITH RADIOACTIVE SEED LOCALIZATION (Left: Breast)  Patient Location: PACU  Anesthesia Type:General  Level of Consciousness: awake, drowsy, and patient cooperative  Airway & Oxygen Therapy: Patient Spontanous Breathing and Patient connected to face mask oxygen  Post-op Assessment: Report given to RN and Post -op Vital signs reviewed and stable  Post vital signs: Reviewed and stable  Last Vitals:  Vitals Value Taken Time  BP 107/85 07/20/22 1100  Temp    Pulse 85 07/20/22 1100  Resp 17 07/20/22 1100  SpO2 100 % 07/20/22 1100  Vitals shown include unvalidated device data.  Last Pain:  Vitals:   07/20/22 0838  TempSrc: Oral  PainSc: 0-No pain         Complications: No notable events documented.

## 2022-07-20 NOTE — Anesthesia Postprocedure Evaluation (Signed)
Anesthesia Post Note  Patient: Megan Dickerson  Procedure(s) Performed: LEFT BREAST LUMPECTOMY WITH RADIOACTIVE SEED LOCALIZATION (Left: Breast)     Patient location during evaluation: PACU Anesthesia Type: General Level of consciousness: awake and alert and oriented Pain management: pain level controlled Vital Signs Assessment: post-procedure vital signs reviewed and stable Respiratory status: spontaneous breathing, nonlabored ventilation and respiratory function stable Cardiovascular status: blood pressure returned to baseline and stable Postop Assessment: no apparent nausea or vomiting Anesthetic complications: no   No notable events documented.  Last Vitals:  Vitals:   07/20/22 1100 07/20/22 1115  BP: 107/85 104/66  Pulse: 98 72  Resp: 12 15  Temp: (!) 36.3 C   SpO2: 100% 100%    Last Pain:  Vitals:   07/20/22 1115  TempSrc:   PainSc: 0-No pain                 Ivon Oelkers A.

## 2022-07-20 NOTE — Anesthesia Procedure Notes (Signed)
Procedure Name: LMA Insertion Date/Time: 07/20/2022 10:22 AM  Performed by: Karen Kitchens, CRNAPre-anesthesia Checklist: Patient identified, Emergency Drugs available, Suction available and Patient being monitored Patient Re-evaluated:Patient Re-evaluated prior to induction Oxygen Delivery Method: Circle system utilized Preoxygenation: Pre-oxygenation with 100% oxygen Induction Type: IV induction Ventilation: Mask ventilation without difficulty LMA: LMA inserted LMA Size: 4.0 Number of attempts: 1 Airway Equipment and Method: Bite block Placement Confirmation: positive ETCO2, breath sounds checked- equal and bilateral and CO2 detector Tube secured with: Tape Dental Injury: Teeth and Oropharynx as per pre-operative assessment

## 2022-07-20 NOTE — Anesthesia Procedure Notes (Signed)
Procedure Name: LMA Insertion Date/Time: 07/20/2022 10:27 AM  Performed by: Karen Kitchens, CRNAPre-anesthesia Checklist: Patient identified, Emergency Drugs available, Suction available and Patient being monitored Patient Re-evaluated:Patient Re-evaluated prior to induction Oxygen Delivery Method: Circle system utilized Preoxygenation: Pre-oxygenation with 100% oxygen Induction Type: IV induction Ventilation: Mask ventilation without difficulty LMA: LMA inserted LMA Size: 3.0 Number of attempts: 1 Airway Equipment and Method: Bite block Placement Confirmation: positive ETCO2 Tube secured with: Tape Dental Injury: Teeth and Oropharynx as per pre-operative assessment

## 2022-07-20 NOTE — Interval H&P Note (Signed)
History and Physical Interval Note:  07/20/2022 8:39 AM  Megan Dickerson  has presented today for surgery, with the diagnosis of LEFT BREAST ATYPICAL DUCTAL HYPERPLASIA WITH CALCIFICATIONS.  The various methods of treatment have been discussed with the patient and family. After consideration of risks, benefits and other options for treatment, the patient has consented to  Procedure(s): LEFT BREAST LUMPECTOMY WITH RADIOACTIVE SEED LOCALIZATION (Left) as a surgical intervention.  The patient's history has been reviewed, patient examined, no change in status, stable for surgery.  I have reviewed the patient's chart and labs.  Questions were answered to the patient's satisfaction.     Wynona Luna

## 2022-07-20 NOTE — Op Note (Signed)
Pre-op Diagnosis:  Left breast atypical ductal hyperplasia Post-op Diagnosis: same Procedure:  Left radioactive seed localized lumpectomy Surgeon:  Roanna Reaves K. Anesthesia:  GEN - LMA Indications:  This is a 42 year old female in good health with no family history of breast cancer who presents after recent mammogram. This was her first screening mammogram. She was found to have some calcifications in the left breast as well as a mass. She underwent further workup that showed a 4 cm x 1.7 x 1.5 cm area of calcifications in the medial left breast from 8 30-9 30 middle depth. She also has a 1.2 x 0.5 x 0.9 cm area located at 9:00 3 cm from the nipple. She also has a cyst in the medial left breast measuring 1.3 cm. She underwent biopsy of the mass located at 9:00 3 cm from the nipple. 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 However further review of pathology adjusted the diagnosis to atypical ductal hyperplasia. The wider area of calcifications was not biopsied. She is accompanied by her husband today. The patient has no first-degree relatives. 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 No previous breast problems or breast surgeries. She did breast-feed.   A radioactive seed was placed yesterday by Radiology.  Description of procedure: The patient is brought to the operating room placed in supine position on the operating room table. After an adequate level of general anesthesia was obtained, her left breast was prepped with ChloraPrep and draped in sterile fashion. A timeout was taken to ensure the proper patient and proper procedure. We interrogated the breast with the neoprobe. We made a circumareolar incision around the medial side of the nipple after infiltrating with 0.25% Marcaine. Dissection was carried down in the breast tissue with cautery. We used the neoprobe to guide [place] towards the radioactive seed. We excised an area of tissue around the radioactive seed 3 cm in  diameter. The specimen was removed and was oriented with a paint kit. Specimen mammogram showed the radioactive seed as well as the biopsy clip within the specimen. This was sent for pathologic examination. There is no residual radioactivity within the biopsy cavity. We inspected carefully for hemostasis. The wound was thoroughly irrigated. The wound was closed with a deep layer of 3-0 Vicryl and a subcuticular layer of 4-0 Monocryl. Benzoin Steri-Strips were applied. The patient was then extubated and brought to the recovery room in stable condition. All sponge, instrument, and needle counts are correct.  Wilmon Arms. Corliss Skains, MD, [facility] Surgery  General/ Trauma Surgery  07/20/2022 11:04 AM

## 2022-07-20 NOTE — Discharge Instructions (Addendum)
[facility] Office Phone Number [phone]  BREAST BIOPSY/ PARTIAL MASTECTOMY: POST OP INSTRUCTIONS  Always review your discharge instruction sheet given to you by the facility where your surgery was performed.  IF YOU HAVE DISABILITY OR FAMILY LEAVE FORMS, YOU MUST BRING THEM TO THE OFFICE FOR PROCESSING.  DO NOT GIVE THEM TO YOUR DOCTOR.  A prescription for pain medication may be given to you upon discharge.  Take your pain medication as prescribed, if needed.  If narcotic pain medicine is not needed, then you may take acetaminophen (Tylenol) or ibuprofen (Advil) as needed. Take your usually prescribed medications unless otherwise directed If you need a refill on your pain medication, please contact your pharmacy.  They will contact our office to request authorization.  Prescriptions will not be filled after 5pm or on week-ends. You should eat very light the first 24 hours after surgery, such as soup, crackers, pudding, etc.  Resume your normal diet the day after surgery. Most patients will experience some swelling and bruising in the breast.  Ice packs and a good support bra will help.  Swelling and bruising can take several days to resolve.  It is common to experience some constipation if taking pain medication after surgery.  Increasing fluid intake and taking a stool softener will usually help or prevent this problem from occurring.  A mild laxative (Milk of Magnesia or Miralax) should be taken according to package directions if there are no bowel movements after 48 hours. Unless discharge instructions indicate otherwise, you may remove your bandages 24-48 hours after surgery, and you may shower at that time.  You may have steri-strips (small skin tapes) in place directly over the incision.  These strips should be left on the skin for 7-10 days.  ACTIVITIES:  You may resume regular daily activities (gradually increasing) beginning the next day.  Wearing a good support bra or  sports bra minimizes pain and swelling.  You may have sexual intercourse when it is comfortable. You may drive when you no longer are taking prescription pain medication, you can comfortably wear a seatbelt, and you can safely maneuver your car and apply brakes. RETURN TO WORK:  ______________________________________________________________________________________ Bonita Quin should see your doctor in the office for a follow-up appointment approximately two weeks after your surgery.  Your doctor's nurse will typically make your follow-up appointment when she calls you with your pathology report.  Expect your pathology report 2-3 business days after your surgery.  You may call to check if you do not hear from [place] after three days. OTHER INSTRUCTIONS: _______________________________________________________________________________________________ _____________________________________________________________________________________________________________________________________ _____________________________________________________________________________________________________________________________________ _____________________________________________________________________________________________________________________________________  WHEN TO CALL YOUR DOCTOR: Fever over 101.0 Nausea and/or vomiting. Extreme swelling or bruising. Continued bleeding from incision. Increased pain, redness, or drainage from the incision.  The clinic staff is available to answer your questions during regular business hours.  Please don't hesitate to call and ask to speak to one of the nurses for clinical concerns.  If you have a medical emergency, go to the nearest emergency room or call 911.  A surgeon from [facility], [facility], [facility] is always on call at the hospital.  For further questions, please visit [email or web address]   You may have Tylenol again after 2:45pm today, if needed.   Post Anesthesia Home Care  Instructions  Activity: Get plenty of rest for the remainder of the day. A responsible individual must stay with you for 24 hours following the procedure.  For the next 24 hours, DO NOT: -Drive a car -Advertising copywriter -Drink alcoholic  beverages -Take any medication unless instructed by your physician -Make any legal decisions or sign important papers.  Meals: Start with liquid foods such as gelatin or soup. Progress to regular foods as tolerated. Avoid greasy, spicy, heavy foods. If nausea and/or vomiting occur, drink only clear liquids until the nausea and/or vomiting subsides. Call your physician if vomiting continues.  Special Instructions/Symptoms: Your throat may feel dry or sore from the anesthesia or the breathing tube placed in your throat during surgery. If this causes discomfort, gargle with warm salt water. The discomfort should disappear within 24 hours.  If you had a scopolamine patch placed behind your ear for the management of post- operative nausea and/or vomiting:  1. The medication in the patch is effective for 72 hours, after which it should be removed.  Wrap patch in a tissue and discard in the trash. Wash hands thoroughly with soap and water. 2. You may remove the patch earlier than 72 hours if you experience unpleasant side effects which may include dry mouth, dizziness or visual disturbances. 3. Avoid touching the patch. Wash your hands with soap and water after contact with the patch.

## 2022-07-21 ENCOUNTER — Encounter (HOSPITAL_BASED_OUTPATIENT_CLINIC_OR_DEPARTMENT_OTHER): Payer: Self-pay | Admitting: Surgery

## 2022-07-22 LAB — SURGICAL PATHOLOGY

## 2022-10-11 DIAGNOSIS — Z08 Encounter for follow-up examination after completed treatment for malignant neoplasm: Secondary | ICD-10-CM | POA: Diagnosis not present

## 2022-10-11 DIAGNOSIS — D225 Melanocytic nevi of trunk: Secondary | ICD-10-CM | POA: Diagnosis not present

## 2022-10-11 DIAGNOSIS — Z1283 Encounter for screening for malignant neoplasm of skin: Secondary | ICD-10-CM | POA: Diagnosis not present

## 2022-10-11 DIAGNOSIS — Z85820 Personal history of malignant melanoma of skin: Secondary | ICD-10-CM | POA: Diagnosis not present

## 2022-12-07 DIAGNOSIS — B078 Other viral warts: Secondary | ICD-10-CM | POA: Diagnosis not present

## 2023-04-12 DIAGNOSIS — Z85820 Personal history of malignant melanoma of skin: Secondary | ICD-10-CM | POA: Diagnosis not present

## 2023-04-12 DIAGNOSIS — D225 Melanocytic nevi of trunk: Secondary | ICD-10-CM | POA: Diagnosis not present

## 2023-04-12 DIAGNOSIS — Z1283 Encounter for screening for malignant neoplasm of skin: Secondary | ICD-10-CM | POA: Diagnosis not present

## 2023-04-12 DIAGNOSIS — Z08 Encounter for follow-up examination after completed treatment for malignant neoplasm: Secondary | ICD-10-CM | POA: Diagnosis not present

## 2023-06-14 DIAGNOSIS — Z1231 Encounter for screening mammogram for malignant neoplasm of breast: Secondary | ICD-10-CM | POA: Diagnosis not present

## 2023-06-22 DIAGNOSIS — S61412A Laceration without foreign body of left hand, initial encounter: Secondary | ICD-10-CM | POA: Diagnosis not present

## 2023-06-30 DIAGNOSIS — S61412A Laceration without foreign body of left hand, initial encounter: Secondary | ICD-10-CM | POA: Diagnosis not present

## 2023-10-20 DIAGNOSIS — L2989 Other pruritus: Secondary | ICD-10-CM | POA: Diagnosis not present

## 2024-02-08 DIAGNOSIS — R5383 Other fatigue: Secondary | ICD-10-CM | POA: Diagnosis not present

## 2024-02-08 DIAGNOSIS — Z1331 Encounter for screening for depression: Secondary | ICD-10-CM | POA: Diagnosis not present

## 2024-02-08 DIAGNOSIS — E559 Vitamin D deficiency, unspecified: Secondary | ICD-10-CM | POA: Diagnosis not present

## 2024-02-08 DIAGNOSIS — Z136 Encounter for screening for cardiovascular disorders: Secondary | ICD-10-CM | POA: Diagnosis not present

## 2024-02-08 DIAGNOSIS — Z0000 Encounter for general adult medical examination without abnormal findings: Secondary | ICD-10-CM | POA: Diagnosis not present

## 2024-02-08 DIAGNOSIS — Z1339 Encounter for screening examination for other mental health and behavioral disorders: Secondary | ICD-10-CM | POA: Diagnosis not present

## 2024-02-08 DIAGNOSIS — Z131 Encounter for screening for diabetes mellitus: Secondary | ICD-10-CM | POA: Diagnosis not present
